# Patient Record
Sex: Female | Born: 1945 | Race: Black or African American | Hispanic: No | Smoking: Never smoker
Health system: Southern US, Community
[De-identification: ages and names within clinical notes are randomized; demographics above are authoritative.]

## PROBLEM LIST (undated history)

## (undated) DIAGNOSIS — M1990 Unspecified osteoarthritis, unspecified site: Secondary | ICD-10-CM

## (undated) DIAGNOSIS — F419 Anxiety disorder, unspecified: Secondary | ICD-10-CM

## (undated) DIAGNOSIS — E2839 Other primary ovarian failure: Secondary | ICD-10-CM

## (undated) DIAGNOSIS — M8580 Other specified disorders of bone density and structure, unspecified site: Secondary | ICD-10-CM

## (undated) DIAGNOSIS — M810 Age-related osteoporosis without current pathological fracture: Secondary | ICD-10-CM

## (undated) DIAGNOSIS — Z780 Asymptomatic menopausal state: Secondary | ICD-10-CM

## (undated) DIAGNOSIS — N952 Postmenopausal atrophic vaginitis: Secondary | ICD-10-CM

## (undated) DIAGNOSIS — E288 Other ovarian dysfunction: Secondary | ICD-10-CM

## (undated) DIAGNOSIS — M5136 Other intervertebral disc degeneration, lumbar region: Secondary | ICD-10-CM

## (undated) DIAGNOSIS — I10 Essential (primary) hypertension: Secondary | ICD-10-CM

## (undated) HISTORY — PX: EYE SURGERY: SHX253

## (undated) HISTORY — DX: Anxiety disorder, unspecified: F41.9

## (undated) HISTORY — DX: Other ovarian dysfunction: E28.8

## (undated) HISTORY — PX: APPENDECTOMY: SHX54

## (undated) HISTORY — DX: Other intervertebral disc degeneration, lumbar region: M51.36

## (undated) HISTORY — DX: Postmenopausal atrophic vaginitis: N95.2

## (undated) HISTORY — PX: TOE SURGERY: SHX1073

## (undated) HISTORY — DX: Other primary ovarian failure: E28.39

## (undated) HISTORY — DX: Age-related osteoporosis without current pathological fracture: M81.0

## (undated) HISTORY — DX: Asymptomatic menopausal state: Z78.0

## (undated) HISTORY — DX: Other specified disorders of bone density and structure, unspecified site: M85.80

## (undated) HISTORY — PX: OTHER SURGICAL HISTORY: SHX169

---

## 1999-01-02 ENCOUNTER — Encounter: Admission: RE | Admit: 1999-01-02 | Discharge: 1999-01-02 | Payer: Self-pay | Admitting: Family Medicine

## 1999-01-02 ENCOUNTER — Encounter: Payer: Self-pay | Admitting: Family Medicine

## 1999-01-11 ENCOUNTER — Encounter: Admission: RE | Admit: 1999-01-11 | Discharge: 1999-01-11 | Payer: Self-pay | Admitting: Family Medicine

## 1999-01-11 ENCOUNTER — Encounter: Payer: Self-pay | Admitting: Family Medicine

## 1999-02-06 ENCOUNTER — Other Ambulatory Visit: Admission: RE | Admit: 1999-02-06 | Discharge: 1999-02-06 | Payer: Self-pay | Admitting: Obstetrics and Gynecology

## 1999-02-20 DIAGNOSIS — Z780 Asymptomatic menopausal state: Secondary | ICD-10-CM

## 1999-02-20 HISTORY — DX: Asymptomatic menopausal state: Z78.0

## 1999-02-21 ENCOUNTER — Encounter: Payer: Self-pay | Admitting: Family Medicine

## 1999-02-21 ENCOUNTER — Encounter: Admission: RE | Admit: 1999-02-21 | Discharge: 1999-02-21 | Payer: Self-pay | Admitting: Family Medicine

## 1999-04-03 ENCOUNTER — Encounter: Admission: RE | Admit: 1999-04-03 | Discharge: 1999-04-03 | Payer: Self-pay | Admitting: Family Medicine

## 1999-04-03 ENCOUNTER — Encounter: Payer: Self-pay | Admitting: Family Medicine

## 1999-08-20 HISTORY — PX: ABDOMINAL HYSTERECTOMY: SHX81

## 1999-08-22 ENCOUNTER — Inpatient Hospital Stay (HOSPITAL_COMMUNITY): Admission: RE | Admit: 1999-08-22 | Discharge: 1999-08-24 | Payer: Self-pay | Admitting: Obstetrics and Gynecology

## 1999-08-22 ENCOUNTER — Encounter (INDEPENDENT_AMBULATORY_CARE_PROVIDER_SITE_OTHER): Payer: Self-pay | Admitting: Specialist

## 2000-01-12 ENCOUNTER — Encounter: Payer: Self-pay | Admitting: Family Medicine

## 2000-01-12 ENCOUNTER — Encounter: Admission: RE | Admit: 2000-01-12 | Discharge: 2000-01-12 | Payer: Self-pay | Admitting: Family Medicine

## 2000-11-06 ENCOUNTER — Ambulatory Visit (HOSPITAL_COMMUNITY): Admission: RE | Admit: 2000-11-06 | Discharge: 2000-11-06 | Payer: Self-pay | Admitting: *Deleted

## 2000-11-06 ENCOUNTER — Encounter (INDEPENDENT_AMBULATORY_CARE_PROVIDER_SITE_OTHER): Payer: Self-pay | Admitting: Specialist

## 2001-01-14 ENCOUNTER — Encounter: Admission: RE | Admit: 2001-01-14 | Discharge: 2001-01-14 | Payer: Self-pay | Admitting: Family Medicine

## 2001-01-14 ENCOUNTER — Encounter: Payer: Self-pay | Admitting: Family Medicine

## 2001-05-07 ENCOUNTER — Other Ambulatory Visit: Admission: RE | Admit: 2001-05-07 | Discharge: 2001-05-07 | Payer: Self-pay | Admitting: Obstetrics and Gynecology

## 2002-03-10 ENCOUNTER — Encounter: Payer: Self-pay | Admitting: Family Medicine

## 2002-03-10 ENCOUNTER — Encounter: Admission: RE | Admit: 2002-03-10 | Discharge: 2002-03-10 | Payer: Self-pay | Admitting: Family Medicine

## 2002-03-13 ENCOUNTER — Encounter: Payer: Self-pay | Admitting: Family Medicine

## 2002-03-13 ENCOUNTER — Encounter: Admission: RE | Admit: 2002-03-13 | Discharge: 2002-03-13 | Payer: Self-pay | Admitting: Family Medicine

## 2002-09-27 ENCOUNTER — Emergency Department (HOSPITAL_COMMUNITY): Admission: EM | Admit: 2002-09-27 | Discharge: 2002-09-27 | Payer: Self-pay | Admitting: Emergency Medicine

## 2002-09-28 ENCOUNTER — Inpatient Hospital Stay (HOSPITAL_COMMUNITY): Admission: AD | Admit: 2002-09-28 | Discharge: 2002-10-03 | Payer: Self-pay | Admitting: Internal Medicine

## 2002-09-29 ENCOUNTER — Encounter: Payer: Self-pay | Admitting: Internal Medicine

## 2002-09-30 ENCOUNTER — Encounter: Payer: Self-pay | Admitting: Internal Medicine

## 2002-10-01 ENCOUNTER — Encounter: Payer: Self-pay | Admitting: Internal Medicine

## 2002-11-02 ENCOUNTER — Encounter: Payer: Self-pay | Admitting: Family Medicine

## 2002-11-02 ENCOUNTER — Encounter: Admission: RE | Admit: 2002-11-02 | Discharge: 2002-11-02 | Payer: Self-pay | Admitting: Family Medicine

## 2003-04-26 ENCOUNTER — Encounter: Admission: RE | Admit: 2003-04-26 | Discharge: 2003-04-26 | Payer: Self-pay | Admitting: Family Medicine

## 2003-08-17 ENCOUNTER — Ambulatory Visit (HOSPITAL_COMMUNITY): Admission: RE | Admit: 2003-08-17 | Discharge: 2003-08-17 | Payer: Self-pay | Admitting: Family Medicine

## 2003-09-01 ENCOUNTER — Encounter: Admission: RE | Admit: 2003-09-01 | Discharge: 2003-09-01 | Payer: Self-pay | Admitting: Obstetrics and Gynecology

## 2003-09-16 ENCOUNTER — Ambulatory Visit (HOSPITAL_COMMUNITY): Admission: RE | Admit: 2003-09-16 | Discharge: 2003-09-16 | Payer: Self-pay | Admitting: Family Medicine

## 2004-04-28 ENCOUNTER — Ambulatory Visit (HOSPITAL_COMMUNITY): Admission: RE | Admit: 2004-04-28 | Discharge: 2004-04-28 | Payer: Self-pay | Admitting: Family Medicine

## 2004-05-05 ENCOUNTER — Ambulatory Visit (HOSPITAL_COMMUNITY): Admission: RE | Admit: 2004-05-05 | Discharge: 2004-05-05 | Payer: Self-pay | Admitting: *Deleted

## 2004-05-26 ENCOUNTER — Encounter: Admission: RE | Admit: 2004-05-26 | Discharge: 2004-05-26 | Payer: Self-pay | Admitting: Family Medicine

## 2004-11-15 ENCOUNTER — Encounter: Admission: RE | Admit: 2004-11-15 | Discharge: 2004-11-15 | Payer: Self-pay | Admitting: Family Medicine

## 2005-01-24 ENCOUNTER — Encounter: Admission: RE | Admit: 2005-01-24 | Discharge: 2005-01-24 | Payer: Self-pay | Admitting: Family Medicine

## 2005-07-13 ENCOUNTER — Encounter: Admission: RE | Admit: 2005-07-13 | Discharge: 2005-07-13 | Payer: Self-pay | Admitting: Family Medicine

## 2005-08-28 ENCOUNTER — Ambulatory Visit (HOSPITAL_BASED_OUTPATIENT_CLINIC_OR_DEPARTMENT_OTHER): Admission: RE | Admit: 2005-08-28 | Discharge: 2005-08-28 | Payer: Self-pay | Admitting: Radiation Oncology

## 2006-06-18 ENCOUNTER — Encounter: Admission: RE | Admit: 2006-06-18 | Discharge: 2006-06-18 | Payer: Self-pay | Admitting: Rheumatology

## 2006-07-16 ENCOUNTER — Encounter: Admission: RE | Admit: 2006-07-16 | Discharge: 2006-07-16 | Payer: Self-pay | Admitting: Family Medicine

## 2006-11-24 ENCOUNTER — Encounter: Admission: RE | Admit: 2006-11-24 | Discharge: 2006-11-24 | Payer: Self-pay | Admitting: Family Medicine

## 2006-12-31 ENCOUNTER — Encounter: Admission: RE | Admit: 2006-12-31 | Discharge: 2006-12-31 | Payer: Self-pay | Admitting: Family Medicine

## 2007-01-07 ENCOUNTER — Encounter: Admission: RE | Admit: 2007-01-07 | Discharge: 2007-01-07 | Payer: Self-pay | Admitting: Family Medicine

## 2007-07-17 ENCOUNTER — Encounter: Admission: RE | Admit: 2007-07-17 | Discharge: 2007-07-17 | Payer: Self-pay | Admitting: Family Medicine

## 2008-07-20 ENCOUNTER — Encounter: Admission: RE | Admit: 2008-07-20 | Discharge: 2008-07-20 | Payer: Self-pay | Admitting: Family Medicine

## 2008-07-20 DIAGNOSIS — M8580 Other specified disorders of bone density and structure, unspecified site: Secondary | ICD-10-CM

## 2008-07-20 HISTORY — DX: Other specified disorders of bone density and structure, unspecified site: M85.80

## 2008-07-23 ENCOUNTER — Encounter: Admission: RE | Admit: 2008-07-23 | Discharge: 2008-07-23 | Payer: Self-pay | Admitting: Obstetrics and Gynecology

## 2008-10-06 ENCOUNTER — Encounter: Admission: RE | Admit: 2008-10-06 | Discharge: 2008-10-06 | Payer: Self-pay | Admitting: Family Medicine

## 2008-12-23 ENCOUNTER — Encounter: Admission: RE | Admit: 2008-12-23 | Discharge: 2008-12-23 | Payer: Self-pay | Admitting: Obstetrics and Gynecology

## 2009-04-14 ENCOUNTER — Encounter: Admission: RE | Admit: 2009-04-14 | Discharge: 2009-04-14 | Payer: Self-pay | Admitting: Family Medicine

## 2009-09-16 ENCOUNTER — Encounter: Admission: RE | Admit: 2009-09-16 | Discharge: 2009-09-16 | Payer: Self-pay | Admitting: Family Medicine

## 2010-03-06 ENCOUNTER — Encounter
Admission: RE | Admit: 2010-03-06 | Discharge: 2010-03-06 | Payer: Self-pay | Source: Home / Self Care | Attending: Gastroenterology | Admitting: Gastroenterology

## 2010-04-07 ENCOUNTER — Ambulatory Visit (HOSPITAL_COMMUNITY): Payer: Self-pay

## 2010-04-10 ENCOUNTER — Other Ambulatory Visit (HOSPITAL_COMMUNITY): Payer: Self-pay | Admitting: Gastroenterology

## 2010-04-14 ENCOUNTER — Ambulatory Visit (HOSPITAL_COMMUNITY)
Admission: RE | Admit: 2010-04-14 | Discharge: 2010-04-14 | Disposition: A | Discharge status: Home / Self Care | Payer: BC Managed Care – PPO | Source: Ambulatory Visit | Attending: Gastroenterology | Admitting: Gastroenterology

## 2010-04-14 ENCOUNTER — Ambulatory Visit (HOSPITAL_COMMUNITY): Payer: BC Managed Care – PPO

## 2010-04-14 DIAGNOSIS — R1310 Dysphagia, unspecified: Secondary | ICD-10-CM | POA: Insufficient documentation

## 2010-05-22 ENCOUNTER — Other Ambulatory Visit: Payer: Self-pay | Admitting: Family Medicine

## 2010-05-22 ENCOUNTER — Ambulatory Visit
Admission: RE | Admit: 2010-05-22 | Discharge: 2010-05-22 | Disposition: A | Discharge status: Home / Self Care | Payer: BC Managed Care – PPO | Source: Ambulatory Visit | Attending: Family Medicine | Admitting: Family Medicine

## 2010-05-22 DIAGNOSIS — R05 Cough: Secondary | ICD-10-CM

## 2010-05-22 DIAGNOSIS — R059 Cough, unspecified: Secondary | ICD-10-CM

## 2010-07-07 NOTE — Op Note (Signed)
Rebecca Rasmussen, SPURGIN               ACCOUNT NO.:  [id number]   MEDICAL RECORD NO.:  [id number]          PATIENT TYPE:  AMB   LOCATION:  DSC                          FACILITY:  [facility]   PHYSICIAN:  Leonides Grills, M.D.     DATE OF BIRTH:  1945-07-18   DATE OF PROCEDURE:  DATE OF DISCHARGE:                                 OPERATIVE REPORT   PREOPERATIVE DIAGNOSIS:  Left great toe interphalangeal joint sesamoiditis.   POSTOPERATIVE DIAGNOSIS:  Left great toe interphalangeal joint sesamoiditis.   OPERATION:  Left great toe IP joint sesamoidectomy.   ANESTHESIA:  General.   SURGEON:  Leonides Grills, M.D.   MD ASSISTANT:  Lianne Cure, P.A.   ESTIMATED BLOOD LOSS:  Minimal.   TOURNIQUET TIME:  Approximately 20 minutes.   COMPLICATIONS:  None.   DISPOSITION:  Stable to the PAR.   INDICATIONS:  This is a 65 year old female who has had longstanding plantar  great toe pain underneath the IP joint, directly underneath the sesamoid  with callous formation.  Despite pads and anti-inflammatories, she has had  persistent pain.  She has consented for the above procedure.  All risks  which would include infection, nerve vessel injury, persistent pain, cock-up  toe deformity, 3-IP joint contractures, stiffness, arthritis and weakness  were all explained.  Questions were encouraged and answered.   OPERATION:  Patient was brought to the operation room and placed in the  supine position.  After adequate general endotracheal tube anesthesia was  administered as well as Ancef 1 gram IV piggyback.  The left lower extremity  was then prepped and draped in a sterile manner over a proximal placed thigh  tourniquet.  The wound was gradually exsanguinated and tourniquet was  elevated with 290 mmHg.  A longitudinal incision over the plantar aspect of  the great toe IP joint and just distal to this was then made.  Dissection  was carried out through skin and hemostasis was obtained.  FHL tendon was  then  identified and retracted out of harm's way.  The sesamoid was then  identified and  dissected out carefully.  Once this was removed, the joint area was  copiously irrigated with normal saline.  Skin was closed with #4-0 nylon  suture.  Sterile dressing was applied.  Hard-soled shoe was applied.  Patient was stable to the PAR.      Leonides Grills, M.D.  Electronically Signed     PB/MEDQ  D:  08/28/2005  T:  08/28/2005  Job:  [id number]

## 2010-07-07 NOTE — H&P (Signed)
[facility]  Patient:    Rebecca Rasmussen                      MRN: [id number] Adm. Date:  [id number] Attending:  Jenean Lindau                         History and Physical  IDENTIFYING DATA:  Rebecca Rasmussen is a 65 year old menopausal female with enlarging, increasing symptomatic fibroids, admitted for TAH and BSO.  HISTORY OF PRESENT ILLNESS:  Rebecca Rasmussen is a 65 year old, nulligravid, African-American female who was initially referred in 1998 by Duncan Dull, M.D., for right lower quadrant pain.  She had been menopausal and on hormonal replacement therapy since the age of 30.  She had a long history of fibroids, but had never had any problems with these.  Following that initial visit, she was noted to have an approximately 6-week size uterus that was found to have multiple small fibroids.  At that time, she was treated conservatively.  She continued to be monitored and her uterus was noted to gradually enlarge to approximately 10-12 weeks size.  Her fibroids were followed up on a recent ultrasound and she was noted to have multiple small fibroids with the largest one measuring approximately 4 cm in the right lower quadrant, which was a source of most of her pain.  She noted progressively worsening right lower quadrant pain, dyspareunia, and back pain, as well as increasing urinary frequency.  The patient was given the option of stopping her hormone replacement therapy to see if this improved her symptoms and perhaps allowed the fibroids to diminish in size.  She attempted this option briefly, but had recurrence of symptoms.  She is therefore admitted to undergo definitive surgical management.  She is to undergo a TAH and BSO.  She has been extensively counseled as to the risks, benefits, options, and complications and agrees to proceed.  PAST MEDICAL HISTORY:  Hypercholesterolemia on medication.  PAST SURGICAL HISTORY:  Tonsillectomy at the age  of 44.  PAST OBSTETRICAL HISTORY:  Nulligravida.  PAST GYNECOLOGICAL HISTORY:  Premature menopause at the age of 39 on hormone replacement therapy since.  Last Pap smear in December of 2000 within normal limits.  Mammogram in November of 2000 within normal limits.  ALLERGIES:  PREDNISOLONE causes an unknown reaction.  DARVOCET causes a rash. ALEVE and NAPROSYN cause a rash, although ibuprofen is tolerated.  AUGMENTIN causes vomiting.  TRANSFUSION HISTORY:  Negative.  CURRENT MEDICATIONS: 1. Estradiol 1 mg q.d. 2. Medroxyprogesterone 10 mg days 1-14 of each month. 3. Claritin 10 mg daily p.r.n. 4. Lipitor 10 mg daily. 5. Tums t.i.d. 6. Centrum vitamin daily. 7. Glucosamine sulfate one q.d. 8. Ferrous sulfate two q.d. 9. She recently completed a course of Zithromax.  SOCIAL HISTORY:  The patient is married.  She has no children.  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  She denies any smoking, alcohol, or illicit drug use.  FAMILY HISTORY:  Notable for hypertension in her mother, otherwise noncontributory.  REVIEW OF SYSTEMS:  Notable for the history of present illness.  She denies any bowel complaints.  She has had no abnormal bleeding.  Cardiovascular and respiratory negative.  Neurologic negative.  PHYSICAL EXAMINATION:  The physical exam performed prior to admission revealed a health African-American female in no apparent distress.  HEIGHT:  5 feet 3-1/2 inches.  WEIGHT:  141 pounds.  VITAL SIGNS:  Blood pressure 130/80, pulse 80 and regular, respirations 16, temperature 97.2 degrees.  HEENT:  Grossly negative.  Oropharynx clear.  NECK:  Supple without thyromegaly or lymphadenopathy.  BACK:  Without spine or CVA tenderness.  LUNGS:  Clear to auscultation.  HEART:  Regular rate and rhythm without murmurs, rubs, or gallops. Carotids +2 and equal without bruit.  Distal pulses full.  ABDOMEN:  Soft without hepatosplenomegaly.  Slight  tenderness in the right lower quadrant to deep palpation with no obvious mass.  PELVIC:  Examination revealed a multinodular uterus, which was mid plane and mobile, tender particularly on the right, and approximately 10-12 weeks size with multiple fibroids.  Adnexa without masses.  Rectovaginal confirmatory.  EXTREMITIES:  Without edema.  No signs of DVT.  NEUROLOGIC:  Grossly nonfocal.  LABORATORY DATA:  Laboratory studies on admission revealed a hemoglobin of 11.6, a hematocrit of 37.5, a white count of 6.2, and platelets of 356. Comprehensive metabolic profile within normal limits.  Coagulation studies normal.  Urinalysis negative.  The chest x-ray is not in the chart.  She had a follow-up chest CT which revealed a stable 9 mm, noncalcified nodule in the left lower lobe, which has not changed since August of 2000.  The EKG revealed normal sinus rhythm and left atrial enlargement with no old tracing to compare.  ASSESSMENT ON ADMISSION: 1. 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 2. Premature menopause on long-term hormonal replacement therapy. 3. Hypercholesterolemia on medication. 4. Stable left lung nodule per CT.  PLAN:  The patient is admitted for same-day surgery.  She will undergo a TAH and BSO under general endotracheal anesthesia.  Full consent has been given. She will continue on her estrogen postoperatively. DD:  08/22/99 TD:  08/22/99 Job: [id number]

## 2010-07-07 NOTE — Discharge Summary (Signed)
NAME:  Rebecca Rasmussen, Rebecca Rasmussen                         ACCOUNT NO.:  [id number]   MEDICAL RECORD NO.:  [id number]                   PATIENT TYPE:  INP   LOCATION:  0456                                 FACILITY:  [facility]   PHYSICIAN:  Rebecca Rasmussen. Polite, M.D.              DATE OF BIRTH:  February 04, 1946   DATE OF ADMISSION:  09/28/2002  DATE OF DISCHARGE:  10/03/2002                                 DISCHARGE SUMMARY   DISCHARGE DIAGNOSIS:  1. Reactive arthritis, probably secondary to diarrhea versus bowel illness,     improved at discharge.  2. Myalgia secondary to #1.  3. Fever secondary to #1.  4. Elevated liver function tests trending down, probably secondary to viral     illness.  5. Right shoulder pain, status post intra-articular injection, improved at     discharge.   DISCHARGE MEDICATIONS:  1. Prednisone in tapering doses 60 mg to zero over six days.  2. Niferex 150 mg b.i.d.  3. Estradiol 2 mg daily.  4. Protonix 40 mg daily.  5. Prednisone taper.   CONSULTANTS:  1. Dr. Ninetta Lights, infection disease.  2. Dr. Kellie Simmering, rheumatology.  3. Dr. Ranell Patrick, orthopedics.   STUDIES:  The patient had a chest x-ray negative for pneumonia, significant  for a small pleural effusion.  Aldolase 6, Lyme disease, IgG and IgM  negative.  HLA B-27 pending.  Mycoplasma and Chlamydia antibody pending.  Group A strep negative.  AST and ALT 44 and 50 respectively.  TSH 1.9,  ferritin 694.  CK within normal limits.  Blood culture negative.  Rheumatoid  factor less than 20.  Urine Chlamydia negative.  ANA negative.  Outpatient  studies - hepatitis A, B, and C negative.  ASO titer 222.  Right shoulder x-  ray with extensive soft tissue calcification in the lateral portion of the  shoulder joint area.  Abdominal ultrasound with mild splenomegaly.  Gallbladder not well distended, but appears normal.  EKG without acute  abnormalities.   HISTORY OF PRESENT ILLNESS:  A 65 year old black female with the above  medical problems who presented to the hospital for evaluation of multiple  complaints of fever, myalgias, polyarthritis, elevated LFTs, and abnormal  chest x-ray with questionable infiltrate.  The patient's symptoms seemed to  have begun approximately two weeks ago when she had a diarrheal illness that  resolved, but subsequently developed symptoms of the above diffuse myalgias,  polyarthritis, fever, and weakness.  Because of the above symptoms,  admission was deemed necessary for further evaluation and treatment.  Please  see dictated H&P for further details of history of present illness.   HOSPITAL COURSE:  1. Symptom complex consistent with inflammatory process characterized by     fever, myalgias, polyarthritis, elevated acute phase labs (i.e.     sedimentation rate of 120), elevated ASO titer of 220.  As stated, the     patient's symptoms started approximately two weeks  prior to admission     with a diarrheal illness.  Because of the progressive nature of her     symptoms and fever as mentioned, it was deemed necessary for further     evaluation and treatment.  It was initially entertained that the patient     may have had pneumonia or bronchitis; however, this was not felt to be     the case after admission to the hospital.  It was also considered to be     rheumatic fever versus a reactive polyarthritis.  Reason for     consideration of rheumatic fever is because of the constellation of     symptoms including the above-mentioned signs and symptoms, plus the     elevated ASO titer and subcu nodules, polyarthritis, and fever.  Because     of this concern, an infectious disease consult was obtained.  It was     agreed that this was a less likely cause because of the rare nature of     its occurrence.  It was felt more prudent that the patient had a reactive     __________ secondary to her diarrheal illness.  Further consultation was     obtained by rheumatology, Dr. Kellie Simmering, who  concurred with that diagnosis     and agreed with steroid taper over six days.  In the interim, the     patient's sign and symptom complex improved dramatically to the point     that she was afebrile, had dramatic improvement in her energy level, and     able to ambulate the hospital floor and tolerate full p.o.'s.  Despite     some of the testing suggesting rheumatic fever, it is still felt by all     three specialties, internal medication, infection disease, and     rheumatology, that this was most likely a reactive __________, and     conservative treatment should improve the above symptoms.  The patient     will be asked to follow up with her primary M.D. in one week, and at that     time to reevaluate if she needs to see the rheumatologist again per Dr.     Ines Bloomer recommendations.  2. Elevated liver function tests.  As stated, the patient has had elevated     LFTs, and evaluation was started on an outpatient basis.  Of most     significance, she has had negative hepatitis studies, negative ANA,     abdominal ultrasound negative for any abnormalities of the liver or     gallbladder.  Of note, the patient is recovering from a presumed viral     illness, and in the past did use a cholesterol medication which has been     stopped.  Currently, the patient is without jaundice, tolerating p.o.  It     is expected that her LFT's will improve with the resolution of problem     #1.  3. Right shoulder pain.  The patient was seen in consultation by the     orthopedist, Dr. Ranell Patrick.  The patient had an x-ray which showed extensive     soft tissue calcification in the lateral aspect of the right shoulder.     The patient underwent intra-articular injection, and had dramatic pain     relief, and dramatic improvement in range of motion.  No further follow     up from an orthopedic standpoint has been made at this time; however, if  indicated, Dr. Ranell Patrick will be glad to follow the patient on an  outpatient     basis if needed.   At this time, the patient is medically stable for discharge with outpatient  follow up.                                               Rebecca Rasmussen. Polite, M.D.    RDP/MEDQ  D:  10/03/2002  T:  10/03/2002  Job:  [id number]   cc:   Duncan Dull, M.D.  [address]  [place]  [place] [address]  Fax: [phone]

## 2010-07-07 NOTE — Consult Note (Signed)
NAME:  Rebecca Rasmussen, Rebecca Rasmussen                         ACCOUNT NO.:  [id number]   MEDICAL RECORD NO.:  [id number]                   PATIENT TYPE:  INP   LOCATION:  0456                                 FACILITY:  [facility]   PHYSICIAN:  Aundra Dubin, M.D.            DATE OF BIRTH:  1945/08/08   DATE OF CONSULTATION:  10/02/2002  DATE OF DISCHARGE:                                   CONSULTATION   CHIEF COMPLAINT:  Fever of unknown origin, polyarthralgia.   HISTORY:  Rebecca Rasmussen is a 65 year old black female who gives a good  history of starting serious diarrhea on July 30.  Two days after this she  was very sore around her shoulders and neck and just ached terribly.  She  had no rash but has noticed some nodules to appear under her skin over time.  By August 2 her whole body was aching, especially the arms, knees, legs, and  back.  When she saw Dr. Kevan Ny on September 22, 2002, she had some mild fever and  was treated with Vioxx.  She did not improve over the week and had to return  on September 25, 2002.  She was having some low-grade fever of about 99 at that  time.  She had some vomiting during this period, and the diarrhea had  stopped.  Her main problem was that she was still aching.  By September 28, 2002, she was quite short of breath.  She was still having fever and a great  deal of pain.  She was felt to have a left lower lobe pneumonia and was  admitted.  She has had no swollen joints.  She does not feel that she has  lost any appreciable weight.  Prior to the achiness beginning in early  August, she generally did not have a pain problem.  At this point she is  feeling about 75% better.  In addition to the leg pain, she was having  abdominal pain.  As she was admitted, laboratories showed low-grade anemia  of about 11.3.  She had an AST of 44, ALT 77, albumin 3.4.  Platelets 394,  WBC 6.8 and 6.6.  TSH 1.98.  She has had a negative ANA, rheumatoid factor,  and parvovirus B19.  Her chest  x-ray on September 30, 2002, showed some minimal  blunting of the costophrenic angles and mild generalized peribronchial  thickening.  There was no definite infiltrate or consolidation.  She has  been treated with antibiotics during this hospitalization.   PAST MEDICAL/SURGICAL HISTORY:  1. Hyperlipidemia.  2. Hysterectomy.  3. Tonsillectomy.   CURRENT MEDICINES:  1. Niferex 150 mg b.i.d.  2. Estradiol 2 mg daily.  3. Protonix 40 mg daily.  4. Claritin p.r.n.  5. Hydrocodone p.r.n.  6. Ambien 10 mg h.s.  7. Avelox.   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  She does not smoke or drink alcohol.  She is married.   FAMILY HISTORY:  Her mother has a history of lung cancer and hypertension.  Her father has died.   PHYSICAL EXAMINATION:  VITAL SIGNS:  Temperature afebrile, blood pressure  157/87, respirations 16, pulse 80.  GENERAL:  She is in no distress.  SKIN:  I do palpate a few slight nodules under the skin of the forearms.  These were not remarkable.  There is no malar rash or nail-fold dilatation.  HEENT:  Bilateral sclerae are injected to the lateral side of the pupil.  The eyes were slightly tender. PERL/EOMI.  Mouth clear.  NECK:  No adenopathy.  Normal thyroid.  LUNGS:  Clear.  HEART:  Regular.  No murmur.  ABDOMEN:  Negative HSM.  Nontender.  MUSCULOSKELETAL:  The hands have a mild diffuse swelling to the PIPs and  MCPs.  These areas were tender.  Right wrist was mildly swollen and  moderately tender.  The left wrist had mild tenderness.  Elbows were tender.  The shoulders move stiffly, especially the right, which has been injected.  Trigger points around the shoulder, neck, occiput, anterior chest, and upper  paraspinous muscles were nontender.  The knees were cool but stiff and had  some mild tenderness.  The ankles and feet were cool and nontender.  NEUROLOGIC:  Nonfocal.   ASSESSMENT AND PLAN:  Fever of unknown  origin with polyarthritis.  There is  some mild swelling to the joints, and I think this is possibly a reactive  arthritis.  I would suspect a viral entity with the mild increased liver  enzymes, fever, and an albumin slightly low at 3.4.  She did not have  leukocytosis.  She has also had a mildly elevated ASO, which I believe is  nonspecific.  Usually with rheumatic fever, fevers remain over 102 for days.  Hers has been low-grade fever of 100-100.8.  I believe she will benefit from  a short course of prednisone.  The chart indicates an allergy to  PREDNISOLONE, but I believe it was some intolerance and not a true allergy  to steroids.  At the present time I do not believe that she has  fibromyalgia.  This pain came on with the bouts of diarrhea and shortly  thereafter.   I will see her at this time.  She is well enough to go on home.  She should  follow up with Dr. Kevan Ny in about one week, and if I am needed after this I  should be contacted.                                               Aundra Dubin, M.D.    WWT/MEDQ  D:  10/02/2002  T:  10/02/2002  Job:  [id number]   cc:   Duncan Dull, M.D.  [address]  [place]  [place] [address]  Fax: [phone]

## 2010-07-07 NOTE — H&P (Signed)
NAME:  Rebecca Rasmussen, Rebecca Rasmussen                         ACCOUNT NO.:  [id number]   MEDICAL RECORD NO.:  [id number]                   PATIENT TYPE:  INP   LOCATION:  0456                                 FACILITY[facility]   PHYSICIAN:  Sherin Quarry, MD                   DATE OF BIRTH:  10-Apr-1945   DATE OF ADMISSION:  09/28/2002  DATE OF DISCHARGE:                                HISTORY & PHYSICAL   PROBLEM LIST:  1. Probable left lower lobe pneumonia.  2. Chronic pain syndrome, possible fibromyalgia.  3. Hyperlipidemia.  4. Status post hysterectomy and bilateral salpingo-oophorectomy.  5. Allergic rhinitis.  6. History of anemia.   HISTORY OF PRESENT ILLNESS:  This 65 year old lady reports onset  approximately 1 week ago of an illness characterized by vomiting  subsequently followed by low grade fever, nonproductive cough, diffuse chest  ache, frontal headache and malaise.  Vomiting and diarrhea have resolved.  The patient has been experiencing diffuse arthralgias for several weeks.  She thought these might perhaps be due to side effects of the Crestor and  for this reason she stopped taking this medication about 5 weeks ago.  This  does not seem to have helped very much.  In Dr. Kevan Ny' office and in the  emergency room on Sunday evening workup included a CMET which showed normal  liver functions, a CBC which revealed a white count of 6900, a hemoglobin of  10.9, a urinalysis which revealed few white cells and a chest x-ray which  showed possible left lower lobe infiltrate.  The patient had previously had  an ANA, CK and RA Latex which were all normal or negative.  She is admitted  for evaluation of these complaints.   PAST MEDICAL HISTORY:   ALLERGIES:  She is allergic to PREDNISOLONE, DARVOCET, ALEVE, NAPROSYN, and  AUGMENTIN.   CURRENT MEDICATIONS:  Estradiol 2 mg daily, Crestor which she stopped about  5 weeks ago, Vicodin p.r.n., Ferro-Sequels p.r.n., Allegra-D p.r.n.,  Nasacort  p.r.n. and glucosamine 500 mg three times daily.   ILLNESSES:  The patient has a past history of hyperlipidemia which  apparently is well regulated.   OPERATIONS:  She is status post hysterectomy and bilateral salpingo-  oophorectomy and tonsillectomy.   FAMILY HISTORY:  The patient's mother had a history of lung cancer and  hypertension.  Her father died of unknown cause.  Her siblings are said to  be in good health.   SOCIAL HISTORY:  The patient works as a Runner, broadcasting/film/video for [employer].  She denies any history of smoking or alcohol abuse.  She denies  history of drug abuse.   REVIEW OF SYSTEMS:  HEAD: See above.  EYES: She denies visual blurring,  diplopia.  EARS/NOSE AND THROAT: She denies earache, sinus pain, or sore  throat.  CHEST: She denies chronic symptoms of respiratory difficulty  however, the last  several days she feels that she has been slightly more  short of breath than usual.  There has been no productive cough.  CARDIOVASCULAR: She denies orthopnea, PND, ankle edema or exertional chest  pain.  GI: See above.  GU: She denies dysuria, urinary frequency, hesitancy  or nocturia.  RHEUMATOLOGIC: The patient has had chronic complaints of  arthralgias without objective signs of arthritis.  HEMATOLOGIC: She denies  easy bleeding or bruising.  NEUROLOGIC: There is no history of seizure or  stroke.   PHYSICAL EXAMINATION:  HEENT:  Within normal limits.  CHEST:  Decreased breath sounds at the left base with mild wheezing and  scattered rhonchi.  CARDIOVASCULAR:  Normal S1 and S2 without rubs, murmurs, or gallops.  ABDOMEN:  Within normal limits.  There are no masses, tenderness, or  organomegaly.  NEUROLOGIC TESTING:  Within normal limits.  EXTREMITIES:  No swelling or synovial thickening of the joints.  There is no  rashes or edema.   ASSESSMENT AND PLAN:  I will assume that the patient's diagnosis of  pneumonia is accurate.  I reviewed her chest x-rays.   She does seem to have  a left lower lobe infiltrate.  I will start her on oxygen, nebulizers and  treat her with Avelox 400 mg daily.  I doubt we are going to be able to get  a sputum specimen.  We will continue pain medicines and fluid medicines.  We  will follow her course.  Many of her pain complaints may be chronic.  We  will evaluate the anemia by checking stools for Hemoccult and folate and  B12.                                               Sherin Quarry, MD    SY/MEDQ  D:  09/28/2002  T:  09/28/2002  Job:  [id number]   cc:   Duncan Dull, M.D.  [address]  [place]  [place] [address]  Fax: [phone]

## 2010-07-07 NOTE — Op Note (Signed)
[facility]  Patient:    Rebecca Rasmussen, Rebecca Rasmussen                      MRN: [id number] Proc. Date: 08/22/99 Adm. Date:  [id number] Attending:  Jenean Lindau CC:         Laqueta Linden, M.D.             Duncan Dull, M.D.                           Operative Report  PREOPERATIVE DIAGNOSIS:  Leiomyomata uteri.  POSTOPERATIVE DIAGNOSIS:  Leiomyomata uteri.  PROCEDURE:  Total abdominal hysterectomy with bilateral salpingo-oophorectomy.  SURGEON:  Laqueta Linden, M.D.  ASSISTANT:  Andres Ege, M.D.  ANESTHESIA:  General endotracheal.  ESTIMATED BLOOD LOSS:  Less than 100 cc.  URINE OUTPUT:  500 cc.  FLUIDS:  2200 cc of crystalloid.  COUNTS:  Correct x 2.  COMPLICATIONS:  None.  INDICATION FOR PROCEDURE:  Rebecca Rasmussen is a 65 year old menopausal female on hormone replacement therapy who has had progressively debilitating pelvic pain related to known multiple fibroids. Clinically she had an enlarged fibroid uterus. She had predominantly right-sided fibroids. She was noticing right lower quadrant and right back pain with increasing numbers of fibroids on serial ultrasound examinations. She was given the option of temporarily discontinuing her hormone replacement therapy to see if this helped the situation but this caused an increase in menopausal symptoms. She is to therefore undergo TAH, BSO as definitive surgical management. She has had no problems with abnormal bleeding. She has been extensively counseled as to the risks, benefits, options, and complications of the procedure and agrees to proceed.  DESCRIPTION OF PROCEDURE:  The patient was taken to the operating room and after proper identification and consents were ascertained, she was placed on the operating table in the supine position. She had received 1 gm of Ancef as preoperative antibiotic prophylaxis. After the induction of general endotracheal anesthesia, she was placed in the frog  leg position and the abdomen, perineum and vagina were prepped and draped in a routine sterile fashion. A transurethral Foley was placed. A Pfannenstiel incision was then made and carried down to the level of the anterior rectus fascia. The fascia was opened laterally, superiorly and inferiorly and the rectus muscles were separated. The parietoperitoneum was elevated and incised and the incision extended superiorly and inferiorly to the level of the bladder. Palpation of the upper abdomen revealed smooth renal contours bilaterally, smooth spleen tip and liver edge with no palpable gallstones. The appendix was noted to be quite long but had no obvious stone, adhesions or other lesions noted. A self retaining retractor was placed and the bile packed into the upper abdomen using moistened lap packs. Inspection of the pelvis revealed the uterus to be distorted by multiple serosal fibroids. The fibroids extended onto the anterior lower uterine segment on the right also out onto the round ligament and fallopian tubes on the left. Both ovaries were tiny and menopausal in appearance. There were no other pelvic lesions identified other than multiple benign appearing fibroids. The uterus was elevated in the operative field. The found ligaments were clamped, cut and suture ligated. Dissection was carried forward in the anterior leaf of the broad ligament with advancement of the bladder off of the anterior lower uterine segment and cervix. A window was made in the posterior round ligament and after identification of  the ureter well out of the operative field. Curved Heaney clamps were placed across the infundibulopelvic ligaments bilaterally. These pedicles were cut and doubly ligated with a free tie a stitch of #0 Vicryl. The uterine vessels were then skeletonized bilaterally. Curved Heaney clamps were placed perpendicularly across the vessels at the level of the internal os. Pedicles were cut  and suture ligated. Successive straight Heaney clamps were placed across the cardinal ligaments bilaterally after further advancement of the bladder. Pedicles were cut and suture ligated in a routine fashion.  This was carried down to the level of the upper vaginal angles. Curved Heaney clamps were placed across the upper vaginal angle with pedicles cut and the Satinsky scissors were then used to circumscribe the cervix with removal of the entire specimen which was sent to pathology. The vaginal angles were plicated in a routine Richardson angled stitch. The remainder of the vaginal cuff was closed interrupted figure-of-eight sutures of #0 Vicryl. Copious lavage was accomplished. Hemostasis was noted to be excellent.  Uterosacral ligaments were plicated posteriorly. Lavage was again accomplished. All pedicles were hemostatic. There was no active bleeding noted. All lap packs were removed. Needle, sponge and instrument counts were correct prior to closure of the abdomen. The parietoperitoneum was closed in a running fashion using 2-0 Vicryl suture. The rectus muscles were loosely reapproximated in the midline. After subfascial hemostasis was ascertained, the fascia was closed from both lateral aspects to the midline using a running stitch of #0 Maxon. Subcutaneous hemostasis was ascertained. The skin was closed with staples and Steri-Strips and pressure dressings were then applied. The patient was stable and extubated on transfer to the recovery room. Estimated blood loss was less than 100 cc. Urine output 500 cc. Fluids 2200 cc of crystalloid. Counts correct x 2. Complications none. DD:  08/22/99 TD:  08/22/99 Job: [id number]

## 2010-09-19 ENCOUNTER — Other Ambulatory Visit: Payer: Self-pay | Admitting: Family Medicine

## 2010-09-19 DIAGNOSIS — Z1231 Encounter for screening mammogram for malignant neoplasm of breast: Secondary | ICD-10-CM

## 2010-10-10 ENCOUNTER — Ambulatory Visit
Admission: RE | Admit: 2010-10-10 | Discharge: 2010-10-10 | Disposition: A | Discharge status: Home / Self Care | Payer: BC Managed Care – PPO | Source: Ambulatory Visit | Attending: Family Medicine | Admitting: Family Medicine

## 2010-10-10 DIAGNOSIS — Z1231 Encounter for screening mammogram for malignant neoplasm of breast: Secondary | ICD-10-CM

## 2011-05-09 ENCOUNTER — Ambulatory Visit
Admission: RE | Admit: 2011-05-09 | Discharge: 2011-05-09 | Disposition: A | Discharge status: Home / Self Care | Payer: Medicare Other | Source: Ambulatory Visit | Attending: Family Medicine | Admitting: Family Medicine

## 2011-05-09 ENCOUNTER — Other Ambulatory Visit: Payer: Self-pay | Admitting: Family Medicine

## 2011-05-09 DIAGNOSIS — R52 Pain, unspecified: Secondary | ICD-10-CM

## 2011-05-09 DIAGNOSIS — R609 Edema, unspecified: Secondary | ICD-10-CM

## 2011-05-20 ENCOUNTER — Ambulatory Visit (INDEPENDENT_AMBULATORY_CARE_PROVIDER_SITE_OTHER): Payer: Medicare Other | Admitting: Family Medicine

## 2011-05-20 VITALS — BP 148/76 | HR 91 | Temp 98.8°F | Resp 16

## 2011-05-20 DIAGNOSIS — R05 Cough: Secondary | ICD-10-CM

## 2011-05-20 DIAGNOSIS — R059 Cough, unspecified: Secondary | ICD-10-CM

## 2011-05-20 DIAGNOSIS — J40 Bronchitis, not specified as acute or chronic: Secondary | ICD-10-CM

## 2011-05-20 DIAGNOSIS — J209 Acute bronchitis, unspecified: Secondary | ICD-10-CM

## 2011-05-20 MED ORDER — BENZONATATE 200 MG PO CAPS: 200.0000 mg | ORAL_CAPSULE | Freq: Two times a day (BID) | ORAL | Status: AC | PRN

## 2011-05-20 MED ORDER — AZITHROMYCIN 250 MG PO TABS: ORAL_TABLET | ORAL | Status: AC

## 2011-05-20 NOTE — Patient Instructions (Signed)

## 2011-05-20 NOTE — Progress Notes (Signed)
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 The cough is nonproductive with yellow to green mucus. He can take Mucinex which has not helped much. Is no sinus congestion, sore throat, earache, fever, or chest pain. She's not short of breath and has no slow swelling on her legs.  Objective: No acute distress, alert and cooperative very friendly.  HEENT: Unremarkable  Chest: Bilateral rhonchi  Heart: Regular no murmur  Extremities no edema  Assessment acute bronchitis, worsening  Plan: Z-Pak and Tessalon

## 2011-09-17 ENCOUNTER — Other Ambulatory Visit: Payer: Self-pay | Admitting: Family Medicine

## 2011-09-17 DIAGNOSIS — Z1231 Encounter for screening mammogram for malignant neoplasm of breast: Secondary | ICD-10-CM

## 2011-10-15 ENCOUNTER — Ambulatory Visit
Admission: RE | Admit: 2011-10-15 | Discharge: 2011-10-15 | Disposition: A | Discharge status: Home / Self Care | Payer: Medicare Other | Source: Ambulatory Visit | Attending: Family Medicine | Admitting: Family Medicine

## 2011-10-15 DIAGNOSIS — Z1231 Encounter for screening mammogram for malignant neoplasm of breast: Secondary | ICD-10-CM

## 2012-05-20 DIAGNOSIS — M5136 Other intervertebral disc degeneration, lumbar region: Secondary | ICD-10-CM

## 2012-05-20 DIAGNOSIS — M51369 Other intervertebral disc degeneration, lumbar region without mention of lumbar back pain or lower extremity pain: Secondary | ICD-10-CM

## 2012-05-20 HISTORY — DX: Other intervertebral disc degeneration, lumbar region: M51.36

## 2012-05-20 HISTORY — DX: Other intervertebral disc degeneration, lumbar region without mention of lumbar back pain or lower extremity pain: M51.369

## 2012-06-23 ENCOUNTER — Encounter: Payer: Self-pay | Admitting: Nurse Practitioner

## 2012-06-23 ENCOUNTER — Ambulatory Visit (INDEPENDENT_AMBULATORY_CARE_PROVIDER_SITE_OTHER): Payer: Medicare Other | Admitting: Nurse Practitioner

## 2012-06-23 VITALS — BP 130/68 | HR 66 | Ht 62.0 in | Wt 135.8 lb

## 2012-06-23 DIAGNOSIS — M8580 Other specified disorders of bone density and structure, unspecified site: Secondary | ICD-10-CM

## 2012-06-23 DIAGNOSIS — M899 Disorder of bone, unspecified: Secondary | ICD-10-CM

## 2012-06-23 DIAGNOSIS — E559 Vitamin D deficiency, unspecified: Secondary | ICD-10-CM

## 2012-06-23 DIAGNOSIS — Z01419 Encounter for gynecological examination (general) (routine) without abnormal findings: Secondary | ICD-10-CM

## 2012-06-23 MED ORDER — VITAMIN D (ERGOCALCIFEROL) 1.25 MG (50000 UNIT) PO CAPS: 50000.0000 [IU] | ORAL_CAPSULE | ORAL | Status: DC

## 2012-06-23 NOTE — Progress Notes (Signed)
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 Some vaginal dryness, currently not sexually active. Denies vaso symptoms.   Recent fall 06/14/12 and during evaluation found that she had DDD of lumbar spine and bone spur of the cervical neck. She is currently In physical therapy .   No LMP recorded. Patient has had a hysterectomy.          Sexually active: no  The current method of family planning is status post hysterectomy.    Exercising: no   Smoker:  no  Health Maintenance: Pap:  05/07/2001 normal  MMG:  09/17/11 normal Colonoscopy:  04/2008 some diverticula and repeat in 5 years BMD:   07/2008 T Score: spine -1.5; Left hip neck -1.4 TDaP:  06/07/2011 Labs: PCP does UA and maintains blood work.    reports that she has never smoked. She does not have any smokeless tobacco history on file. She reports that she does not drink alcohol or use illicit drugs.  Past Medical History  Diagnosis Date  . Osteoporosis     osteopenia  . Menopause   . Anxiety   . Atrophic vaginitis     Past Surgical History  Procedure Laterality Date  . Abdominal hysterectomy  08/1999    Current Outpatient Prescriptions  Medication Sig Dispense Refill  . aspirin 81 MG tablet Take 81 mg by mouth 2 (two) times daily.      . calcium-vitamin D (CALCIUM + D) 250-125 MG-UNIT per tablet Take 1 tablet by mouth daily.      . celecoxib (CELEBREX) 200 MG capsule Take 200 mg by mouth daily. PRN      . Coenzyme Q10 (COQ10) 100 MG CAPS Take by mouth daily.      . colesevelam (WELCHOL) 625 MG tablet Take 1,875 mg by mouth 2 (two) times daily with a meal.      . esomeprazole (NEXIUM) 40 MG capsule Take 40 mg by mouth daily before breakfast.      . Glucosamine-Chondroit-Vit C-Mn (GLUCOSAMINE CHONDR 1500 COMPLX) CAPS Take by mouth daily.      . Loratadine (CLARITIN) 10 MG CAPS Take by mouth as needed.      . Magnesium 400 MG CAPS Take by mouth daily.      . mometasone (NASONEX) 50 MCG/ACT nasal spray Place 2  sprays into the nose daily.      . Multiple Vitamins-Minerals (CENTRUM SPECIALIST HEART PO) Take by mouth 2 (two) times daily.      . rosuvastatin (CRESTOR) 10 MG tablet Take 10 mg by mouth 3 (three) times a week.      . vitamin B-12 (CYANOCOBALAMIN) 1000 MCG tablet Take 1,000 mcg by mouth daily.      . vitamin C (ASCORBIC ACID) 500 MG tablet Take 500 mg by mouth daily.      . Vitamin D, Ergocalciferol, (DRISDOL) 50000 UNITS CAPS Take 50,000 Units by mouth every 7 (seven) days.      . vitamin E 100 UNIT capsule Take 400 Units by mouth daily.      . polyvinyl alcohol-povidone (REFRESH) 1.4-0.6 % ophthalmic solution Place 1-2 drops into both eyes 2 (two) times daily. 2 drops in each eye twice daily       No current facility-administered medications for this visit.    History reviewed. No pertinent family history.  ROS:  Pertinent items are noted in HPI.  Otherwise, a comprehensive ROS was negative.  Exam:   BP 130/68  Pulse 66  Ht 5\' 2"  (  1.575 m)  Wt 135 lb 12.8 oz (61.598 kg)  BMI 24.83 kg/m2 Height: 5\' 2"  (157.5 cm)  Ht Readings from Last 3 Encounters:  06/23/12 5\' 2"  (1.575 m)    General appearance: alert, cooperative and appears stated age Head: Normocephalic, without obvious abnormality, atraumatic Neck: no adenopathy, supple, symmetrical, trachea midline and thyroid normal to inspection and palpation Lungs: clear to auscultation bilaterally Breasts: normal appearance, no masses or tenderness Heart: regular rate and rhythm Abdomen: soft, non-tender; no masses,  no organomegaly Extremities: extremities normal, atraumatic, no cyanosis or edema Skin: Skin color, texture, turgor normal. No rashes or lesions Lymph nodes: Cervical, supraclavicular, and axillary nodes normal. No abnormal inguinal nodes palpated Neurologic: Grossly normal   Pelvic: External genitalia:  no lesions              Urethra:  normal appearing urethra with no masses, tenderness or lesions               Bartholin's and Skene's: normal                 Vagina: normal appearing vagina with normal color and discharge, no lesions              Cervix: absent              Pap taken: no Bimanual Exam:  Uterus:  uterus absent              Adnexa: no mass, fullness, tenderness               Rectovaginal: Confirms               Anus:  normal sphincter tone, no lesions  A:  Well Woman with normal exam  S/P TAH secondary to fibroids ERT 7/01 - 2/07  Osteopenia  Vit D def.  P:   Pap smear as per guidelines   Mammogram due 09/2012  Order placed for BMD  Vit D 50,000 IU refilled - directions pending labs  counseled on osteoporosis, adequate intake of calcium and vitamin D,   diet and exercise  return annually or prn  An After Visit Summary was printed and given to the patient.

## 2012-06-23 NOTE — Patient Instructions (Addendum)

## 2012-06-24 LAB — VITAMIN D 25 HYDROXY (VIT D DEFICIENCY, FRACTURES): Vit D, 25-Hydroxy: 62 ng/mL (ref 30–89)

## 2012-06-25 ENCOUNTER — Telehealth: Payer: Self-pay | Admitting: *Deleted

## 2012-06-25 NOTE — Telephone Encounter (Signed)
Pt is aware of vitamin D lab results and is agreeable to vitamin D OTC (400IU qd).

## 2012-06-26 NOTE — Progress Notes (Signed)
Encounter reviewed by Dr. Rifka Ramey Silva.  

## 2012-09-26 ENCOUNTER — Other Ambulatory Visit: Payer: Self-pay

## 2012-09-26 DIAGNOSIS — Z1231 Encounter for screening mammogram for malignant neoplasm of breast: Secondary | ICD-10-CM

## 2012-11-04 IMAGING — RF DG ESOPHAGUS
16 of 20 series · 19 of 24 positions shown · non-contrast
Comparison: None.

CLINICAL DATA: Dysphagia

ESOPHOGRAM/BARIUM SWALLOW
TECHNIQUE: Combined double contrast and single contrast
examination performed using effervescent crystals, thick barium
liquid, and thin barium liquid.
Fluoroscopy time:  2.30 minutes.

[Series 1: run · 2 of 8 slices shown (1 of 16)]
[im 1/8]
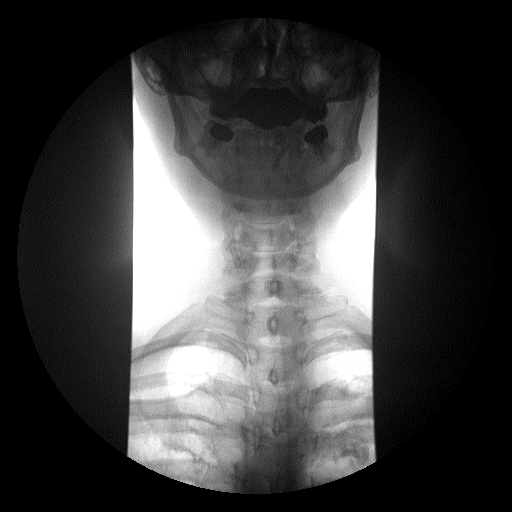
[im 4/8]
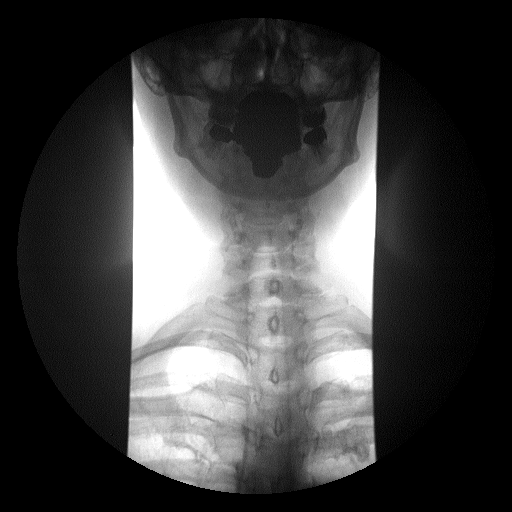

[Series 2: run · 3 of 8 slices shown (2 of 16)]
[im 1/8]
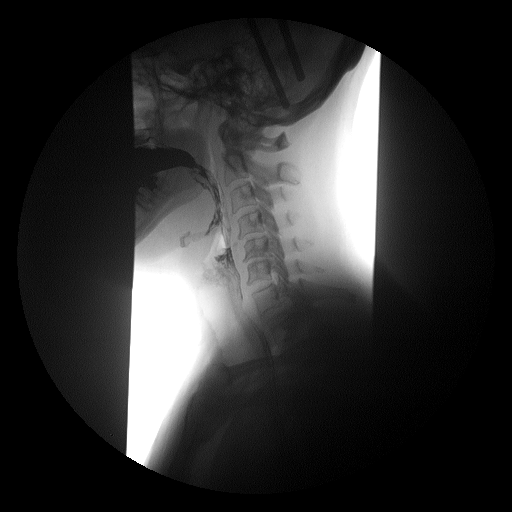
[im 4/8]
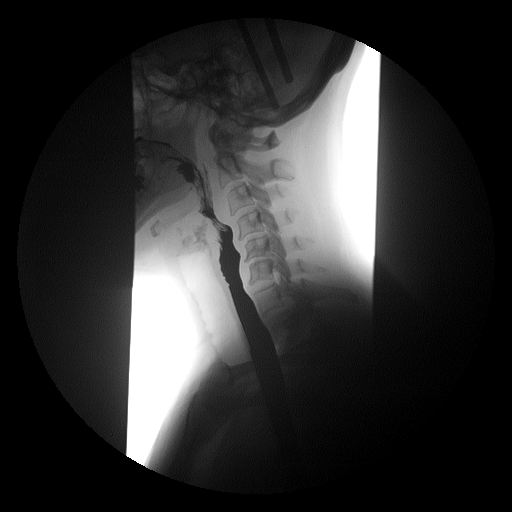
[im 8/8]
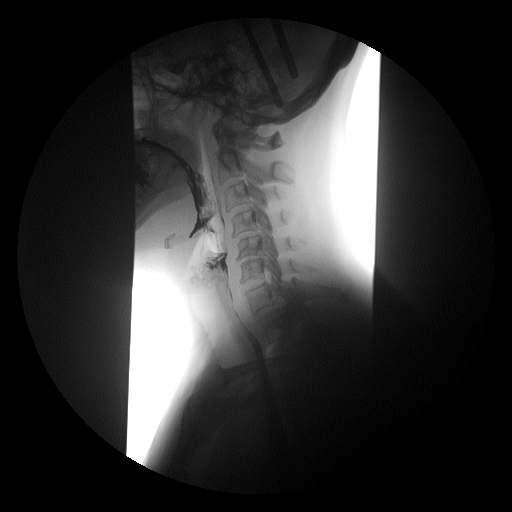

[Series 3: run · 1 of 1 slices shown (3 of 16)]
[im 1/1]
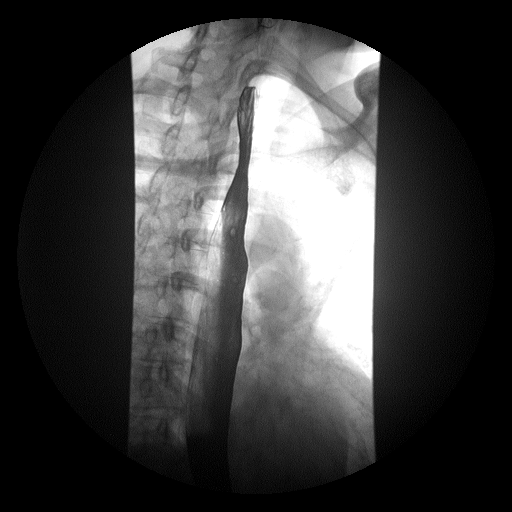

[Series 5: run · 1 of 1 slices shown (4 of 16)]
[im 1/1]
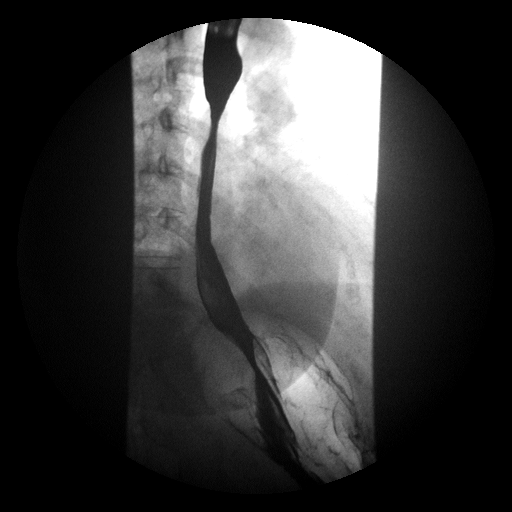

[Series 6: run · 1 of 1 slices shown (5 of 16)]
[im 1/1]
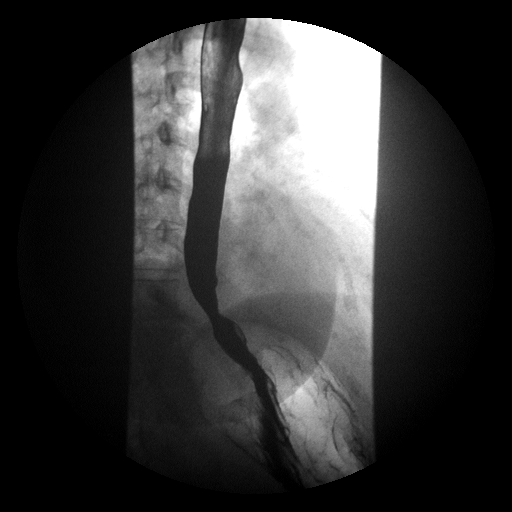

[Series 7: run · 1 of 1 slices shown (6 of 16)]
[im 1/1]
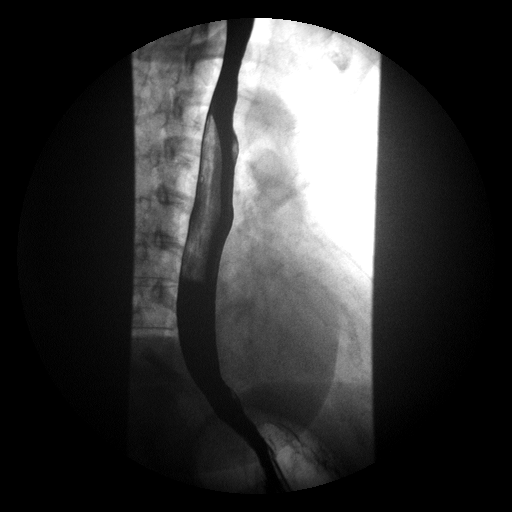

[Series 9: run · 1 of 1 slices shown (7 of 16)]
[im 1/1]
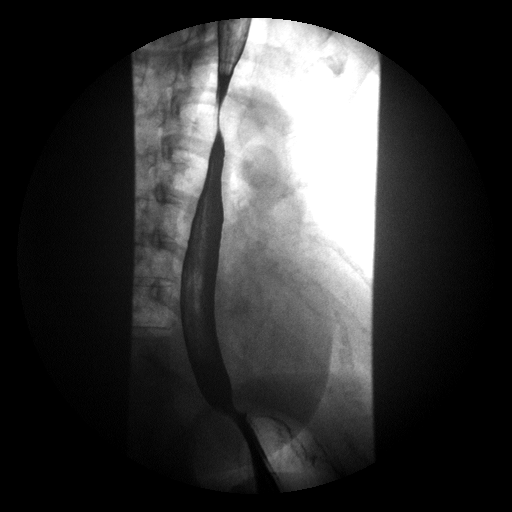

[Series 10: run · 1 of 1 slices shown (8 of 16)]
[im 1/1]
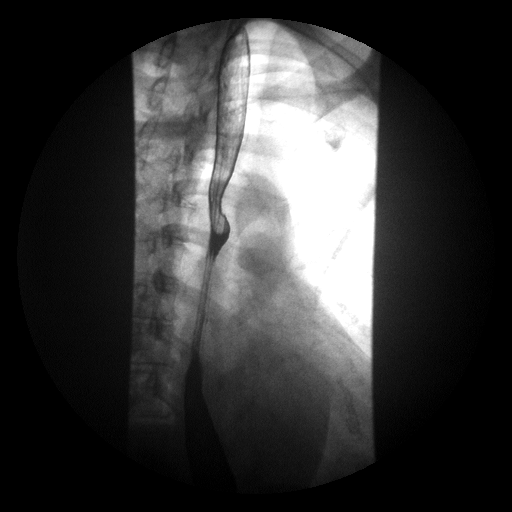

[Series 11: run · 1 of 1 slices shown (9 of 16)]
[im 1/1]
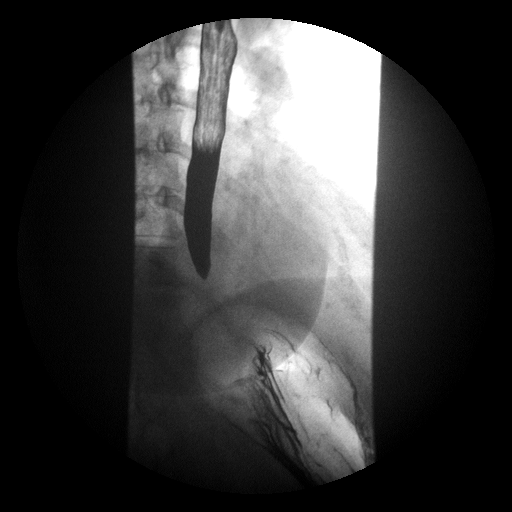

[Series 12: run · 1 of 1 slices shown (10 of 16)]
[im 1/1]
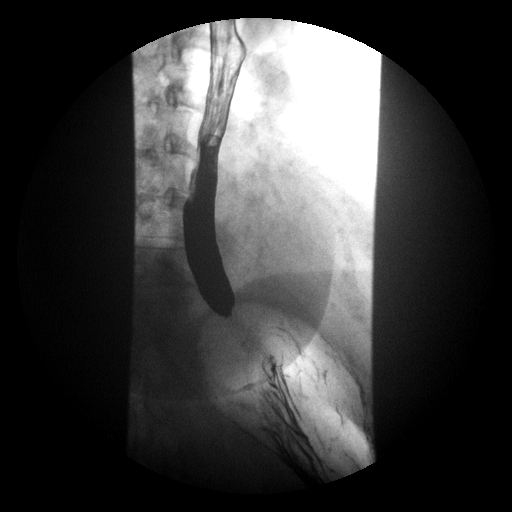

[Series 14: run · 1 of 1 slices shown (11 of 16)]
[im 1/1]
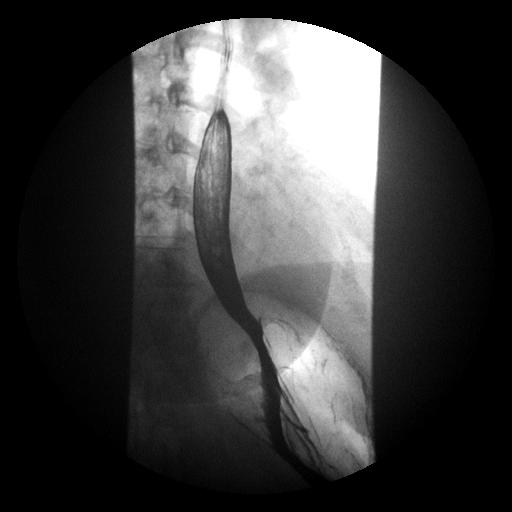

[Series 15: run · 1 of 1 slices shown (12 of 16)]
[im 1/1]
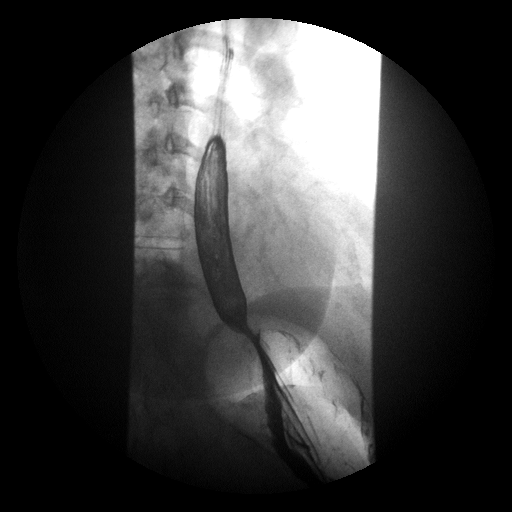

[Series 16: run · 1 of 1 slices shown (13 of 16)]
[im 1/1]
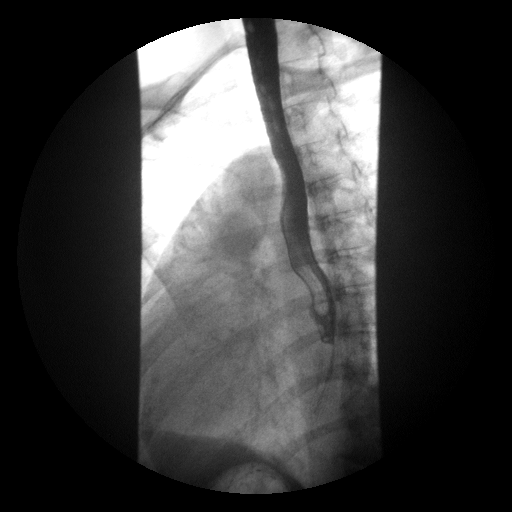

[Series 17: run · 1 of 1 slices shown (14 of 16)]
[im 1/1]
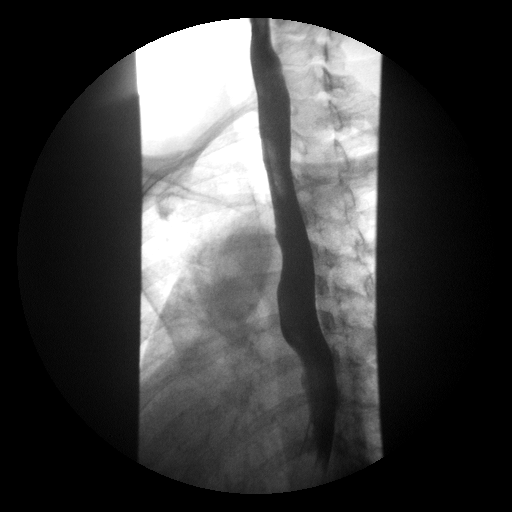

[Series 19: run · 1 of 1 slices shown (15 of 16)]
[im 1/1]
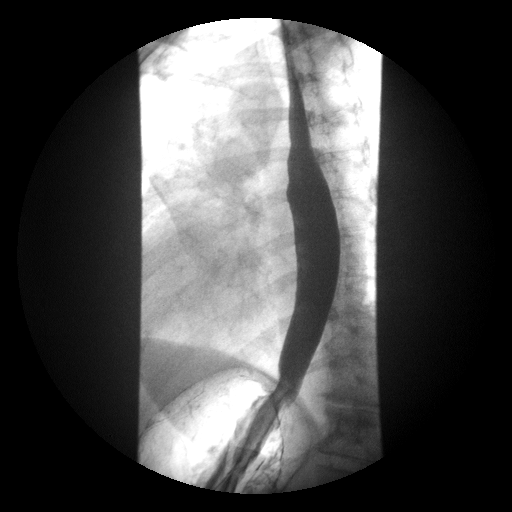

[Series 20: run · 1 of 1 slices shown (16 of 16)]
[im 1/1]
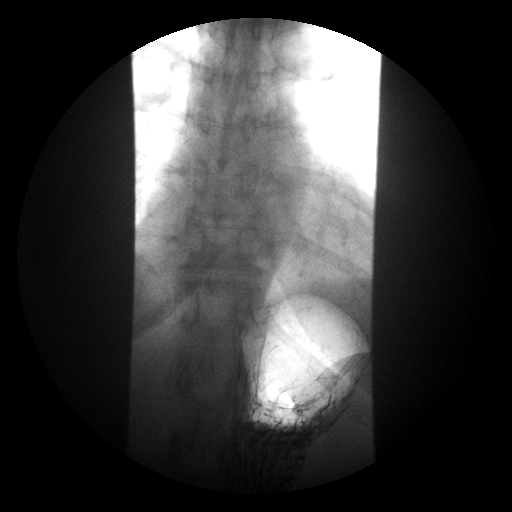

[19 of 24 positions shown; findings below may reference images not displayed]

FINDINGS: Frontal and lateral views of the hypopharynx obtained
while swallowing are normal.  Double contrast imaging of the
esophagus is normal, without evidence for esophageal stricture,
diverticulum, mass lesion, or mucosal ulcer.  There is no evidence
for hiatal hernia.  Esophageal motility is normal.  No evidence for
hiatal hernia.

13 mm barium tablet lodges temporarily in the proximal one third of
the esophagus, but then passes readily into the stomach with a
second sip of water.
IMPRESSION: No evidence for esophageal mass lesion, ulcer, or stricture.

## 2012-11-06 ENCOUNTER — Ambulatory Visit
Admission: RE | Admit: 2012-11-06 | Discharge: 2012-11-06 | Disposition: A | Discharge status: Home / Self Care | Payer: Medicare Other | Source: Ambulatory Visit

## 2012-11-06 DIAGNOSIS — Z1231 Encounter for screening mammogram for malignant neoplasm of breast: Secondary | ICD-10-CM

## 2012-11-13 ENCOUNTER — Encounter (INDEPENDENT_AMBULATORY_CARE_PROVIDER_SITE_OTHER): Payer: Self-pay | Admitting: Surgery

## 2012-11-19 ENCOUNTER — Other Ambulatory Visit (INDEPENDENT_AMBULATORY_CARE_PROVIDER_SITE_OTHER): Payer: Self-pay | Admitting: Surgery

## 2012-11-19 ENCOUNTER — Encounter (INDEPENDENT_AMBULATORY_CARE_PROVIDER_SITE_OTHER): Payer: Self-pay | Admitting: Surgery

## 2012-11-19 ENCOUNTER — Ambulatory Visit (INDEPENDENT_AMBULATORY_CARE_PROVIDER_SITE_OTHER): Payer: Medicare Other | Admitting: Surgery

## 2012-11-19 VITALS — BP 130/76 | HR 68 | Temp 98.0°F | Resp 16 | Ht 62.0 in | Wt 138.2 lb

## 2012-11-19 DIAGNOSIS — R10812 Left upper quadrant abdominal tenderness: Secondary | ICD-10-CM

## 2012-11-19 DIAGNOSIS — R1012 Left upper quadrant pain: Secondary | ICD-10-CM

## 2012-11-19 DIAGNOSIS — R10819 Abdominal tenderness, unspecified site: Secondary | ICD-10-CM

## 2012-11-19 NOTE — Progress Notes (Signed)
Patient ID: Rebecca Rasmussen, female   DOB: 1945-04-11, 67 y.o.   MRN: [id number]  Chief Complaint  Patient presents with  . New Evaluation    eval mass LUQ    HPI Rebecca Rasmussen is a 67 y.o. female.   HPI She is referred by Dr. Shaune Pollack for evaluation of a possible nodule in the abdominal wall of the left upper quadrant.  She saw Dr. Kevan Ny approximately one month ago. At that time she has noticed a mass approximately several weeks prior to that. She described in this tiny nodules. She now has left upper quadrant pain. She has no previous history of trauma to this area and has no previous history of abdominal or subcutaneous changes nodules. Past Medical History  Diagnosis Date  . Osteoporosis     osteopenia  . Menopause 2001    Took ERT 08/1999 - 2/ 2007  . Atrophic vaginitis   . Anxiety   . Osteopenia 07/2008  . DDD (degenerative disc disease), lumbar 05/2012    had a fall with sslipped disck 06/14/12    Past Surgical History  Procedure Laterality Date  . Abdominal hysterectomy  08/1999    secondary to fibroids  . Toe surgery Left about 2008    cyst removed under left great toe    Family History  Problem Relation Age of Onset  . Cancer - Lung Mother 73  . Cirrhosis Father 29  . Heart failure Maternal Grandmother   . Cancer Paternal Grandmother   . Cancer Paternal Grandfather     Social History History  Substance Use Topics  . Smoking status: Never Smoker   . Smokeless tobacco: Never Used  . Alcohol Use: No    Allergies  Allergen Reactions  . Zetia [Ezetimibe] Rash  . Crestor [Rosuvastatin Calcium] Other (See Comments)    Severe muscle pain at higher dose  . Darvocet [Propoxyphene-Acetaminophen] Other (See Comments)    Doesn't remember reaction  . Lipitor [Atorvastatin Calcium] Other (See Comments)    Severe muscle pain  . Prednisone Other (See Comments)    Weight gain  . Promethazine Other (See Comments)    Pt doesn't remember reaction  . Vicodin  [Hydrocodone-Acetaminophen] Nausea Only  . Zocor [Simvastatin - High Dose] Other (See Comments)    Severe muscle pain  . Amoxicillin-Pot Clavulanate Rash  . Naprosyn [Naproxen] Rash  . Naproxen Sodium Rash    Current Outpatient Prescriptions  Medication Sig Dispense Refill  . aspirin 81 MG tablet Take 81 mg by mouth 2 (two) times daily.      . calcium-vitamin D (CALCIUM + D) 250-125 MG-UNIT per tablet Take 1 tablet by mouth daily.      . celecoxib (CELEBREX) 200 MG capsule Take 200 mg by mouth daily. PRN      . Coenzyme Q10 (COQ10) 100 MG CAPS Take by mouth daily.      . colesevelam (WELCHOL) 625 MG tablet Take 1,875 mg by mouth 2 (two) times daily with a meal.      . Glucosamine-Chondroit-Vit C-Mn (GLUCOSAMINE CHONDR 1500 COMPLX) CAPS Take by mouth daily.      . Loratadine (CLARITIN) 10 MG CAPS Take by mouth as needed.      . Magnesium 400 MG CAPS Take by mouth daily.      . mometasone (NASONEX) 50 MCG/ACT nasal spray Place 2 sprays into the nose daily.      . Multiple Vitamins-Minerals (CENTRUM SPECIALIST HEART PO) Take by mouth 2 (two) times daily.      Marland Kitchen  rosuvastatin (CRESTOR) 10 MG tablet Take 10 mg by mouth 3 (three) times a week.      . vitamin B-12 (CYANOCOBALAMIN) 1000 MCG tablet Take 1,000 mcg by mouth daily.      . vitamin C (ASCORBIC ACID) 500 MG tablet Take 500 mg by mouth daily.      . Vitamin D, Ergocalciferol, (DRISDOL) 50000 UNITS CAPS Take 1 capsule (50,000 Units total) by mouth every 7 (seven) days.  30 capsule  3  . vitamin E 100 UNIT capsule Take 400 Units by mouth daily.       No current facility-administered medications for this visit.    Review of Systems Review of Systems  Constitutional: Negative for fever, chills and unexpected weight change.  HENT: Negative for hearing loss, congestion, sore throat, trouble swallowing and voice change.   Eyes: Negative for visual disturbance.  Respiratory: Positive for cough. Negative for wheezing.   Cardiovascular:  Negative for chest pain, palpitations and leg swelling.  Gastrointestinal: Negative for nausea, vomiting, abdominal pain, diarrhea, constipation, blood in stool, abdominal distention and anal bleeding.  Genitourinary: Negative for hematuria, vaginal bleeding and difficulty urinating.  Musculoskeletal: Positive for arthralgias.  Skin: Negative for rash and wound.  Neurological: Negative for seizures, syncope and headaches.  Hematological: Negative for adenopathy. Does not bruise/bleed easily.  Psychiatric/Behavioral: Negative for confusion.    Blood pressure 130/76, pulse 68, temperature 98 F (36.7 C), temperature source Temporal, resp. rate 16, height 5\' 2"  (1.575 m), weight 138 lb 3.2 oz (62.687 kg).  Physical Exam Physical Exam  Constitutional: She is oriented to person, place, and time. She appears well-developed and well-nourished. No distress.  HENT:  Head: Normocephalic and atraumatic.  Right Ear: External ear normal.  Left Ear: External ear normal.  Nose: Nose normal.  Mouth/Throat: Oropharynx is clear and moist.  Eyes: Conjunctivae are normal. Pupils are equal, round, and reactive to light. Right eye exhibits no discharge. Left eye exhibits no discharge. No scleral icterus.  Neck: Normal range of motion. Neck supple. No tracheal deviation present.  Cardiovascular: Normal rate, regular rhythm, normal heart sounds and intact distal pulses.   No murmur heard. Pulmonary/Chest: Effort normal and breath sounds normal. No respiratory distress. She has no wheezes.  Abdominal: Soft. Bowel sounds are normal. There is tenderness. There is guarding.  She has moderate to severe tenderness in the left upper abdominal quadrant. This was along the costal margin. She guards significantly. I have difficulty palpating a mass secondary to her tenderness. There are no skin changes.  Musculoskeletal: Normal range of motion. She exhibits no edema and no tenderness.  Neurological: She is alert and  oriented to person, place, and time.  Skin: Skin is warm and dry. No rash noted. She is not diaphoretic. No erythema.  Psychiatric: Her behavior is normal. Judgment normal.    Data Reviewed   Assessment    Left upper quadrant abdominal pain and tenderness of uncertain etiology     Plan    I believe that She needs a CAT scan of the abdomen and pelvis to evaluate this area better to see if there is indeed a abdominal wall or intramuscular mass or something involving the rib at the costal margin that could be causing this pain and tenderness. I will see her back after the CAT scan is completed to see if surgical intervention is necessary       Chyanne Kohut A 11/19/2012, 2:15 PM

## 2012-11-20 ENCOUNTER — Ambulatory Visit
Admission: RE | Admit: 2012-11-20 | Discharge: 2012-11-20 | Disposition: A | Discharge status: Home / Self Care | Payer: Medicare Other | Source: Ambulatory Visit | Attending: Surgery | Admitting: Surgery

## 2012-11-20 DIAGNOSIS — R10819 Abdominal tenderness, unspecified site: Secondary | ICD-10-CM

## 2012-11-20 DIAGNOSIS — R1012 Left upper quadrant pain: Secondary | ICD-10-CM

## 2012-11-20 MED ORDER — IOHEXOL 300 MG/ML  SOLN
100.0000 mL | Freq: Once | INTRAMUSCULAR | Status: AC | PRN
  Administered 2012-11-20: 100 mL via INTRAVENOUS

## 2012-11-25 ENCOUNTER — Ambulatory Visit (INDEPENDENT_AMBULATORY_CARE_PROVIDER_SITE_OTHER): Payer: Medicare Other | Admitting: Surgery

## 2012-11-25 ENCOUNTER — Encounter (INDEPENDENT_AMBULATORY_CARE_PROVIDER_SITE_OTHER): Payer: Self-pay | Admitting: Surgery

## 2012-11-25 VITALS — BP 128/68 | HR 72 | Temp 98.0°F | Resp 14 | Ht 62.0 in | Wt 139.2 lb

## 2012-11-25 DIAGNOSIS — R1012 Left upper quadrant pain: Secondary | ICD-10-CM

## 2012-11-25 NOTE — Progress Notes (Signed)
Subjective:     Patient ID: Rebecca Rasmussen, female   DOB: 1945-12-01, 67 y.o.   MRN: [id number]  HPI She is here today for reevaluation of her left upper quadrant abdominal discomfort. She reports that she still has intermittent discomfort in the left upper quadrant. It is mild in intensity.  Review of Systems     Objective:   Physical Exam On exam, again she as well in appearance. I cannot palpate any mass in her abdominal wall or any hernia defect on physical examination. She is nontender today.  The CAT scan of the abdomen and pelvis was unremarkable. There were no subcutaneous masses, intra-abdominal masses, or hernia defects    Assessment:     Left upper quadrant abdominal pain of uncertain etiology     Plan:     I suspect this is just musculoskeletal in nature.  There is nothing further I can offer her  From a general surgical standpoint. I would recommend intermittent heat and ice. She may followup with her primary care physician. I will see her as needed

## 2013-01-14 ENCOUNTER — Other Ambulatory Visit: Payer: Self-pay | Admitting: *Deleted

## 2013-01-14 DIAGNOSIS — E782 Mixed hyperlipidemia: Secondary | ICD-10-CM

## 2013-01-29 ENCOUNTER — Telehealth: Payer: Self-pay | Admitting: *Deleted

## 2013-01-29 NOTE — Telephone Encounter (Signed)
Patient requests crestor samples. Is that ok for this patient, and if so what dose is she on? Thanks, MI

## 2013-01-29 NOTE — Telephone Encounter (Signed)
Yes it is ok to provided patient with samples. Taking Crestor 10 MG Tablet 1 tablet four times a week on Monday/Wednesday/Friday/Sat

## 2013-01-30 NOTE — Telephone Encounter (Signed)
Patient aware that they will be left at the front desk for pick up.

## 2013-02-27 ENCOUNTER — Other Ambulatory Visit (INDEPENDENT_AMBULATORY_CARE_PROVIDER_SITE_OTHER): Payer: 59

## 2013-02-27 DIAGNOSIS — E782 Mixed hyperlipidemia: Secondary | ICD-10-CM

## 2013-02-27 LAB — HEPATIC FUNCTION PANEL
ALK PHOS: 54 U/L (ref 39–117)
ALT: 28 U/L (ref 0–35)
AST: 26 U/L (ref 0–37)
Albumin: 4.5 g/dL (ref 3.5–5.2)
BILIRUBIN DIRECT: 0 mg/dL (ref 0.0–0.3)
BILIRUBIN TOTAL: 0.6 mg/dL (ref 0.3–1.2)
Total Protein: 7.1 g/dL (ref 6.0–8.3)

## 2013-02-27 LAB — LIPID PANEL
CHOL/HDL RATIO: 5
Cholesterol: 200 mg/dL (ref 0–200)
HDL: 39.1 mg/dL (ref >39.00)
LDL Cholesterol: 142 mg/dL — ABNORMAL HIGH (ref 0–99)
TRIGLYCERIDES: 97 mg/dL (ref 0.0–149.0)
VLDL: 19.4 mg/dL (ref 0.0–40.0)

## 2013-03-10 ENCOUNTER — Telehealth: Payer: Self-pay | Admitting: *Deleted

## 2013-03-10 NOTE — Telephone Encounter (Signed)
Patient called requesting crestor samples. She is aware that they will be left at the front desk for pick up.

## 2013-05-07 ENCOUNTER — Telehealth: Payer: Self-pay | Admitting: *Deleted

## 2013-05-07 NOTE — Telephone Encounter (Signed)
Patient requests crestor samples. They will be at the front desk for pick up.

## 2013-06-23 ENCOUNTER — Other Ambulatory Visit: Payer: Self-pay | Admitting: Family Medicine

## 2013-06-23 DIAGNOSIS — E559 Vitamin D deficiency, unspecified: Secondary | ICD-10-CM

## 2013-06-26 ENCOUNTER — Telehealth: Payer: Self-pay | Admitting: *Deleted

## 2013-06-26 NOTE — Telephone Encounter (Signed)
Patient called for crestor samples. I will place at the front for pick up.

## 2013-06-29 ENCOUNTER — Ambulatory Visit
Admission: RE | Admit: 2013-06-29 | Discharge: 2013-06-29 | Disposition: A | Discharge status: Home / Self Care | Payer: Medicare Other | Source: Ambulatory Visit | Attending: Family Medicine | Admitting: Family Medicine

## 2013-06-29 ENCOUNTER — Ambulatory Visit (INDEPENDENT_AMBULATORY_CARE_PROVIDER_SITE_OTHER): Payer: Medicare Other | Admitting: Nurse Practitioner

## 2013-06-29 ENCOUNTER — Encounter: Payer: Self-pay | Admitting: Nurse Practitioner

## 2013-06-29 VITALS — BP 130/76 | HR 64 | Ht 62.0 in | Wt 133.0 lb

## 2013-06-29 DIAGNOSIS — Z0000 Encounter for general adult medical examination without abnormal findings: Secondary | ICD-10-CM

## 2013-06-29 DIAGNOSIS — E559 Vitamin D deficiency, unspecified: Secondary | ICD-10-CM

## 2013-06-29 DIAGNOSIS — Z01419 Encounter for gynecological examination (general) (routine) without abnormal findings: Secondary | ICD-10-CM

## 2013-06-29 LAB — POCT URINALYSIS DIPSTICK
BILIRUBIN UA: NEGATIVE
Glucose, UA: NEGATIVE
KETONES UA: NEGATIVE
Leukocytes, UA: NEGATIVE
Nitrite, UA: NEGATIVE
Protein, UA: NEGATIVE
RBC UA: NEGATIVE
Urobilinogen, UA: NEGATIVE
pH, UA: 5

## 2013-06-29 MED ORDER — VITAMIN D (ERGOCALCIFEROL) 1.25 MG (50000 UNIT) PO CAPS: 50000.0000 [IU] | ORAL_CAPSULE | ORAL | Status: DC

## 2013-06-29 NOTE — Patient Instructions (Signed)

## 2013-06-29 NOTE — Progress Notes (Signed)
Patient ID: Rebecca Rasmussen, female   DOB: 1945/11/29, 68 y.o.   MRN: [id number] 68 y.o. G0P0 Married African American Fe here for annual exam.  Still having headaches that are intermittent and seem to be associated with a hot flash.  After a few seconds to minutes that are gone and does not require med's.  They are less severe - both the headaches and vaso symptoms.  Patient's last menstrual period was 08/22/1999.          Sexually active: no  The current method of family planning is abstinence.    Exercising: yes  Home exercise routine includes walking. Smoker:  no  Health Maintenance: Pap:  05/07/2001 normal   MMG:  11/06/12, Bi-Rads 1: negative Colonoscopy:  04/2008 some diverticula and repeat in 5 years BMD:   07/2008 T Score: spine -1.5; Left hip neck -1.4; appt earlier today for BMD[facility] TDaP:  06/07/2011 Labs:  HB:  PCP   Urine:  Negative    reports that she has never smoked. She has never used smokeless tobacco. She reports that she does not drink alcohol or use illicit drugs.  Past Medical History  Diagnosis Date  . Osteoporosis     osteopenia  . Menopause 2001    Took ERT 08/1999 - 2/ 2007  . Atrophic vaginitis   . Anxiety   . Osteopenia 07/2008  . DDD (degenerative disc disease), lumbar 05/2012    had a fall with sslipped disck 06/14/12    Past Surgical History  Procedure Laterality Date  . Abdominal hysterectomy  08/1999    secondary to fibroids  . Toe surgery Left about 2008    cyst removed under left great toe    Current Outpatient Prescriptions  Medication Sig Dispense Refill  . aspirin 81 MG tablet Take 81 mg by mouth 2 (two) times daily.      . calcium-vitamin D (CALCIUM + D) 250-125 MG-UNIT per tablet Take 1 tablet by mouth daily.      . celecoxib (CELEBREX) 200 MG capsule Take 200 mg by mouth daily. PRN      . Coenzyme Q10 (COQ10) 100 MG CAPS Take by mouth daily.      . cycloSPORINE (RESTASIS) 0.05 % ophthalmic emulsion Place 1 drop into both eyes 2  (two) times daily.      . Glucosamine-Chondroit-Vit C-Mn (GLUCOSAMINE CHONDR 1500 COMPLX) CAPS Take by mouth daily.      . Loratadine (CLARITIN) 10 MG CAPS Take by mouth as needed.      . Magnesium 400 MG CAPS Take by mouth daily.      . mometasone (NASONEX) 50 MCG/ACT nasal spray Place 2 sprays into the nose daily.      . Multiple Vitamins-Minerals (CENTRUM SPECIALIST HEART PO) Take by mouth 2 (two) times daily.      . Omega 3 1000 MG CAPS Take 1 capsule by mouth daily.      . rosuvastatin (CRESTOR) 10 MG tablet Take 10 mg by mouth 3 (three) times a week.      . vitamin B-12 (CYANOCOBALAMIN) 1000 MCG tablet Take 1,000 mcg by mouth daily.      . vitamin C (ASCORBIC ACID) 500 MG tablet Take 500 mg by mouth daily.      . Vitamin D, Ergocalciferol, (DRISDOL) 50000 UNITS CAPS capsule Take 1 capsule (50,000 Units total) by mouth every 7 (seven) days.  30 capsule  3  . vitamin E 100 UNIT capsule Take 400 Units by mouth  daily.       No current facility-administered medications for this visit.    Family History  Problem Relation Age of Onset  . Cancer - Lung Mother 4879  . Cirrhosis Father 4860  . Heart failure Maternal Grandmother   . Cancer Paternal Grandmother   . Cancer Paternal Grandfather     ROS:  Pertinent items are noted in HPI.  Otherwise, a comprehensive ROS was negative.  Exam:   BP 130/76  Pulse 64  Ht 5\' 2"  (1.575 m)  Wt 133 lb (60.328 kg)  BMI 24.32 kg/m2  LMP 08/22/1999 Height: 5\' 2"  (157.5 cm)  Ht Readings from Last 3 Encounters:  06/29/13 5\' 2"  (1.575 m)  11/25/12 5\' 2"  (1.575 m)  11/19/12 5\' 2"  (1.575 m)    General appearance: alert, cooperative and appears stated age Head: Normocephalic, without obvious abnormality, atraumatic Neck: no adenopathy, supple, symmetrical, trachea midline and thyroid normal to inspection and palpation Lungs: clear to auscultation bilaterally Breasts: normal appearance, no masses or tenderness Heart: regular rate and rhythm Abdomen:  soft, non-tender; no masses,  no organomegaly Extremities: extremities normal, atraumatic, no cyanosis or edema Skin: Skin color, texture, turgor normal. No rashes or lesions Lymph nodes: Cervical, supraclavicular, and axillary nodes normal. No abnormal inguinal nodes palpated Neurologic: Grossly normal   Pelvic: External genitalia:  no lesions              Urethra:  normal appearing urethra with no masses, tenderness or lesions              Bartholin's and Skene's: normal                 Vagina: normal appearing vagina with normal color and discharge, no lesions              Cervix: absent              Pap taken: no Bimanual Exam:  Uterus:  uterus absent              Adnexa: no mass, fullness, tenderness               Rectovaginal: Confirms               Anus:  normal sphincter tone, no lesions  A:  Well Woman with normal exam  S/P TAH /BSO 08/22/1999 secondary to fibroids  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

## 2013-06-30 ENCOUNTER — Telehealth: Payer: Self-pay | Admitting: Emergency Medicine

## 2013-06-30 LAB — VITAMIN D 25 HYDROXY (VIT D DEFICIENCY, FRACTURES): Vit D, 25-Hydroxy: 59 ng/mL (ref 30–89)

## 2013-06-30 NOTE — Telephone Encounter (Signed)
Message copied by Joeseph AmorFAST, Jayona Mccaig L on Tue Jun 30, 2013  9:01 AM ------      Message from: Ria CommentGRUBB, PATRICIA R      Created: Mon Jun 29, 2013  7:24 PM       French Anaracy this is the patient who needs [facility].  She has had one done there years ago 2002 and again 2010 ?Marland Kitchen.  She is for a 5 year repeat.  Please call and see which MD she saw and then I can place the order or you can. thanks ------

## 2013-06-30 NOTE — Telephone Encounter (Signed)
Pt notified of Vitamin D results.  Also notified of last colonoscopy date and she does not need colonoscopy until 2016.  Pt voices understanding and is agreeable.    Routing to provider for final review.  Pt agreeable to disposition.  Closing encounter.

## 2013-06-30 NOTE — Telephone Encounter (Signed)
[facility] -Phone: [phone]  They state patient last had colonoscopy 05/06/2009 with Dr. Charlott RakesVincent Schooler, [facility].

## 2013-06-30 NOTE — Telephone Encounter (Signed)
I have attempted to contact this patient by phone with the following results: left message to return my call on answering machine (home per DPR).  

## 2013-06-30 NOTE — Telephone Encounter (Signed)
Message copied by Luisa DagoPHILLIPS, Jarion Hawthorne C on Tue Jun 30, 2013 10:43 AM ------      Message from: Ria CommentGRUBB, PATRICIA R      Created: Tue Jun 30, 2013  9:00 AM       Let patient know Vit D level and follow protocol ------

## 2013-06-30 NOTE — Telephone Encounter (Signed)
Message left to return call to Shanisha Lech at [phone].    

## 2013-07-01 NOTE — Progress Notes (Signed)
Encounter reviewed by Dr. Moiz Ryant Silva.  

## 2013-07-06 ENCOUNTER — Telehealth: Payer: Self-pay | Admitting: Nurse Practitioner

## 2013-07-06 NOTE — Telephone Encounter (Signed)
I also went back and reviewed BMD and it is normal and OK to repeat in 5 years. Let patient know

## 2013-07-06 NOTE — Telephone Encounter (Signed)
Spoke with patient. Advised results from BMD on 5/11 are available in patient's chart for Rebecca FranklinPatricia Rolen-Grubb, FNP to review. Patient states that she was told by PCP that everything was normal and she will need another BMD in 5 years. Patient states she would just like Patty to know and was making sure that we had a copy.   Routing to provider for final review. Patient agreeable to disposition. Will close encounter

## 2013-07-06 NOTE — Telephone Encounter (Signed)
Pt calling to check if Rebecca Rasmussen received her bone density results from her primary care doctor.

## 2013-07-07 NOTE — Telephone Encounter (Signed)
Message left at [phone] per DPR of Patty's suggestions.  Advised to call office with any questions.

## 2013-09-03 ENCOUNTER — Encounter: Payer: Self-pay | Admitting: Cardiology

## 2013-09-17 ENCOUNTER — Telehealth: Payer: Self-pay | Admitting: *Deleted

## 2013-09-17 NOTE — Telephone Encounter (Signed)
Crestor samples placed at the front desk for pick up. 

## 2013-09-28 ENCOUNTER — Ambulatory Visit (INDEPENDENT_AMBULATORY_CARE_PROVIDER_SITE_OTHER): Payer: Medicare Other

## 2013-09-28 ENCOUNTER — Ambulatory Visit (INDEPENDENT_AMBULATORY_CARE_PROVIDER_SITE_OTHER): Payer: Medicare Other | Admitting: Family Medicine

## 2013-09-28 VITALS — BP 134/82 | HR 67 | Temp 97.8°F | Resp 16 | Ht 62.5 in | Wt 136.2 lb

## 2013-09-28 DIAGNOSIS — M25511 Pain in right shoulder: Secondary | ICD-10-CM

## 2013-09-28 DIAGNOSIS — M7541 Impingement syndrome of right shoulder: Secondary | ICD-10-CM

## 2013-09-28 DIAGNOSIS — M67919 Unspecified disorder of synovium and tendon, unspecified shoulder: Secondary | ICD-10-CM

## 2013-09-28 DIAGNOSIS — M719 Bursopathy, unspecified: Secondary | ICD-10-CM

## 2013-09-28 DIAGNOSIS — M25519 Pain in unspecified shoulder: Secondary | ICD-10-CM

## 2013-09-28 MED ORDER — METHYLPREDNISOLONE ACETATE 80 MG/ML IJ SUSP: 80.0000 mg | Freq: Once | INTRAMUSCULAR | Status: DC

## 2013-09-28 NOTE — Progress Notes (Signed)
 Chief Complaint:  Chief Complaint  Patient presents with  . Shoulder Pain    x4 days right shoulder pain.  OTC Ibuprofen is used with little relief as well as hot compress.  Movement causes more pain and pain radiates towards the right side of her neck    HPI: Rebecca Rasmussen is a 68 y.o. female who is here for  8-10/10 Right shoulder pain, she has had it for 2 days, OCcured after [place] form beach. NKI. SHe has never had this before. Excrutiating pain when sh tried to lift it above shoudler height. She can;t brush her hair or put her bra on regular, she fastens it in the front and slides it around. She is already on celebrex, she has ahd a hx of frzen shoulder, she has ahd steroid injections in the past without adverse effects, just oral steroids make her gain weight.   Past Medical History  Diagnosis Date  . Osteoporosis     osteopenia  . Menopause 2001    Took ERT 08/1999 - 2/ 2007  . Atrophic vaginitis   . Anxiety   . Osteopenia 07/2008  . DDD (degenerative disc disease), lumbar 05/2012    had a fall with sslipped disck 06/14/12  . Premature ovarian failure age 68   Past Surgical History  Procedure Laterality Date  . Abdominal hysterectomy  08/1999    secondary to fibroids  . Toe surgery Left about 2008    cyst removed under left great toe  . Eye surgery    . Appendectomy     History   Social History  . Marital Status: Married    Spouse Name: N/A    Number of Children: N/A  . Years of Education: N/A   Social History Main Topics  . Smoking status: Never Smoker   . Smokeless tobacco: Never Used  . Alcohol Use: No  . Drug Use: No  . Sexual Activity: No   Other Topics Concern  . None   Social History Narrative  . None   Family History  Problem Relation Age of Onset  . Cancer - Lung Mother 4279  . Cirrhosis Father 5560  . Heart failure Maternal Grandmother   . Cancer Paternal Grandmother   . Cancer Paternal Grandfather    Allergies  Allergen  Reactions  . Zetia [Ezetimibe] Rash  . Crestor [Rosuvastatin Calcium] Other (See Comments)    Severe muscle pain at higher dose  . Darvocet [Propoxyphene N-Acetaminophen] Other (See Comments)    Doesn't remember reaction  . Lipitor [Atorvastatin Calcium] Other (See Comments)    Severe muscle pain  . Prednisone Other (See Comments)    Weight gain  . Promethazine Other (See Comments)    Pt doesn't remember reaction  . Vicodin [Hydrocodone-Acetaminophen] Nausea Only  . Zocor [Simvastatin - High Dose] Other (See Comments)    Severe muscle pain  . Amoxicillin-Pot Clavulanate Rash  . Naprosyn [Naproxen] Rash  . Naproxen Sodium Rash   Prior to Admission medications   Medication Sig Start Date End Date Taking? Authorizing Provider  aspirin 81 MG tablet Take 81 mg by mouth 2 (two) times daily.   Yes Historical Provider, MD  calcium-vitamin D (CALCIUM + D) 250-125 MG-UNIT per tablet Take 1 tablet by mouth daily.   Yes Historical Provider, MD  celecoxib (CELEBREX) 200 MG capsule Take 200 mg by mouth daily. PRN   Yes Historical Provider, MD  Coenzyme Q10 (COQ10) 100 MG CAPS Take by mouth  daily.   Yes Historical Provider, MD  cycloSPORINE (RESTASIS) 0.05 % ophthalmic emulsion Place 1 drop into both eyes 2 (two) times daily.   Yes Historical Provider, MD  Glucosamine-Chondroit-Vit C-Mn (GLUCOSAMINE CHONDR 1500 COMPLX) CAPS Take by mouth daily.   Yes Historical Provider, MD  Loratadine (CLARITIN) 10 MG CAPS Take by mouth as needed.   Yes Historical Provider, MD  Magnesium 400 MG CAPS Take by mouth daily.   Yes Historical Provider, MD  mometasone (NASONEX) 50 MCG/ACT nasal spray Place 2 sprays into the nose daily.   Yes Historical Provider, MD  Multiple Vitamins-Minerals (CENTRUM SPECIALIST HEART PO) Take by mouth 2 (two) times daily.   Yes Historical Provider, MD  Omega 3 1000 MG CAPS Take 1 capsule by mouth daily.   Yes Historical Provider, MD  rosuvastatin (CRESTOR) 10 MG tablet Take 10 mg by  mouth 3 (three) times a week.   Yes Historical Provider, MD  vitamin B-12 (CYANOCOBALAMIN) 1000 MCG tablet Take 1,000 mcg by mouth daily.   Yes Historical Provider, MD  vitamin C (ASCORBIC ACID) 500 MG tablet Take 500 mg by mouth daily.   Yes Historical Provider, MD  Vitamin D, Ergocalciferol, (DRISDOL) 50000 UNITS CAPS capsule Take 1 capsule (50,000 Units total) by mouth every 7 (seven) days. 06/29/13  Yes [name], FNP  vitamin E 100 UNIT capsule Take 400 Units by mouth daily.   Yes Historical Provider, MD     ROS: The patient denies fevers, chills, night sweats, unintentional weight loss, chest pain, palpitations, wheezing, dyspnea on exertion, nausea, vomiting, abdominal pain, dysuria, hematuria, melena, numbness, weakness, or tingling.   All other systems have been reviewed and were otherwise negative with the exception of those mentioned in the HPI and as above.    PHYSICAL EXAM: Filed Vitals:   09/28/13 1125  BP: 134/82  Pulse: 67  Temp: 97.8 F (36.6 C)  Resp: 16   Filed Vitals:   09/28/13 1125  Height: 5' 2.5" (1.588 m)  Weight: 136 lb 3.2 oz (61.78 kg)   Body mass index is 24.5 kg/(m^2).  General: Alert, no acute distress HEENT:  Normocephalic, atraumatic, oropharynx patent. EOMI, PERRLA Cardiovascular:  Regular rate and rhythm, no rubs murmurs or gallops.  No Carotid bruits, radial pulse intact. No pedal edema.  Respiratory: Clear to auscultation bilaterally.  No wheezes, rales, or rhonchi.  No cyanosis, no use of accessory musculature GI: No organomegaly, abdomen is soft and non-tender, positive bowel sounds.  No masses. Skin: No rashes. Neurologic: Facial musculature symmetric. Psychiatric: Patient is appropriate throughout our interaction. Lymphatic: No cervical lymphadenopathy Musculoskeletal: Gait intact. Neg neck exam-neg spurling Right shoulder-decrease ROM due to pain, + pain with PROM espcially in  ER and IR Neg emtpy can, +/- lift off, +  Hawkins/Neers, neg Speeds or AC jt tenderness 5/5 str, 2/2 DTRs   LABS: Results for orders placed in visit on 06/29/13  VITAMIN D 25 HYDROXY      Result Value Ref Range   Vit D, 25-Hydroxy 59  30 - 89 ng/mL  POCT URINALYSIS DIPSTICK      Result Value Ref Range   Color, UA yellow     Clarity, UA clear     Glucose, UA neg     Bilirubin, UA neg     Ketones, UA neg     Spec Grav, UA       Blood, UA neg     pH, UA 5.0     Protein, UA neg  Urobilinogen, UA negative     Nitrite, UA neg     Leukocytes, UA Negative       EKG/XRAY:   Primary read interpreted by [facility]. Conley Rolls at [facility]. Neg for fractures or dil;ocation + DJD, ostepophytes  ASSESSMENT/PLAN: Encounter Diagnoses  Name Primary?  . Right shoulder pain   . Rotator cuff impingement syndrome of right shoulder Yes   Patient wanted injection, already on celebrex, she states the injectons have helped her in the past , last one was several years ago.  Exercises given VCO for injection No skin erythema or wound noted. Risks (including but not limited to bleeding and infection), benefits, and alternatives discussed for RIGHT shoulder injection. Verbal consent obtained after any questions were answered., and verbal understanding expressed. Landmarks noted, and marked as needed. Area cleansed with Betadine x3, ethyl chloride spray for topical anesthesia, followed by alcohol swab. 18 gauge needle used. Patient was injected with 2 ml of plain lidocaine,  2 ml of marcaine and 80 mg or 1 ml of depomedrol.          Gross sideeffects, risk and benefits, and alternatives of medications d/w patient. Patient is aware that all medications have potential sideeffects and we are unable to predict every sideeffect or drug-drug interaction that may occur.  ,  PHUONG, DO 09/28/2013 1:45 PM

## 2013-09-28 NOTE — Patient Instructions (Signed)
Impingement Syndrome, Rotator Cuff, Bursitis with Rehab Impingement syndrome is a condition that involves inflammation of the tendons of the rotator cuff and the subacromial bursa, that causes pain in the shoulder. The rotator cuff consists of four tendons and muscles that control much of the shoulder and upper arm function. The subacromial bursa is a fluid filled sac that helps reduce friction between the rotator cuff and one of the bones of the shoulder (acromion). Impingement syndrome is usually an overuse injury that causes swelling of the bursa (bursitis), swelling of the tendon (tendonitis), and/or a tear of the tendon (strain). Strains are classified into three categories. Grade 1 strains cause pain, but the tendon is not lengthened. Grade 2 strains include a lengthened ligament, due to the ligament being stretched or partially ruptured. With grade 2 strains there is still function, although the function may be decreased. Grade 3 strains include a complete tear of the tendon or muscle, and function is usually impaired. SYMPTOMS   Pain around the shoulder, often at the outer portion of the upper arm.  Pain that gets worse with shoulder function, especially when reaching overhead or lifting.  Sometimes, aching when not using the arm.  Pain that wakes you up at night.  Sometimes, tenderness, swelling, warmth, or redness over the affected area.  Loss of strength.  Limited motion of the shoulder, especially reaching behind the back (to the back pocket or to unhook bra) or across your body.  Crackling sound (crepitation) when moving the arm.  Biceps tendon pain and inflammation (in the front of the shoulder). Worse when bending the elbow or lifting. CAUSES  Impingement syndrome is often an overuse injury, in which chronic (repetitive) motions cause the tendons or bursa to become inflamed. A strain occurs when a force is paced on the tendon or muscle that is greater than it can withstand.  Common mechanisms of injury include: Stress from sudden increase in duration, frequency, or intensity of training.  Direct hit (trauma) to the shoulder.  Aging, erosion of the tendon with normal use.  Bony bump on shoulder (acromial spur). RISK INCREASES WITH:  Contact sports (football, wrestling, boxing).  Throwing sports (baseball, tennis, volleyball).  Weightlifting and bodybuilding.  Heavy labor.  Previous injury to the rotator cuff, including impingement.  Poor shoulder strength and flexibility.  Failure to warm up properly before activity.  Inadequate protective equipment.  Old age.  Bony bump on shoulder (acromial spur). PREVENTION   Warm up and stretch properly before activity.  Allow for adequate recovery between workouts.  Maintain physical fitness:  Strength, flexibility, and endurance.  Cardiovascular fitness.  Learn and use proper exercise technique. PROGNOSIS  If treated properly, impingement syndrome usually goes away within 6 weeks. Sometimes surgery is required.  RELATED COMPLICATIONS   Longer healing time if not properly treated, or if not given enough time to heal.  Recurring symptoms, that result in a chronic condition.  Shoulder stiffness, frozen shoulder, or loss of motion.  Rotator cuff tendon tear.  Recurring symptoms, especially if activity is resumed too soon, with overuse, with a direct blow, or when using poor technique. TREATMENT  Treatment first involves the use of ice and medicine, to reduce pain and inflammation. The use of strengthening and stretching exercises may help reduce pain with activity. These exercises may be performed at home or with a therapist. If non-surgical treatment is unsuccessful after more than 6 months, surgery may be advised. After surgery and rehabilitation, activity is usually possible in 3 months.  MEDICATION  If pain medicine is needed, nonsteroidal anti-inflammatory medicines (aspirin and  ibuprofen), or other minor pain relievers (acetaminophen), are often advised.  Do not take pain medicine for 7 days before surgery.  Prescription pain relievers may be given, if your caregiver thinks they are needed. Use only as directed and only as much as you need.  Corticosteroid injections may be given by your caregiver. These injections should be reserved for the most serious cases, because they may only be given a certain number of times. HEAT AND COLD  Cold treatment (icing) should be applied for 10 to 15 minutes every 2 to 3 hours for inflammation and pain, and immediately after activity that aggravates your symptoms. Use ice packs or an ice massage.  Heat treatment may be used before performing stretching and strengthening activities prescribed by your caregiver, physical therapist, or athletic trainer. Use a heat pack or a warm water soak. SEEK MEDICAL CARE IF:   Symptoms get worse or do not improve in 4 to 6 weeks, despite treatment.  New, unexplained symptoms develop. (Drugs used in treatment may produce side effects.) EXERCISES  RANGE OF MOTION (ROM) AND STRETCHING EXERCISES - Impingement Syndrome (Rotator Cuff  Tendinitis, Bursitis) These exercises may help you when beginning to rehabilitate your injury. Your symptoms may go away with or without further involvement from your physician, physical therapist or athletic trainer. While completing these exercises, remember:   Restoring tissue flexibility helps normal motion to return to the joints. This allows healthier, less painful movement and activity.  An effective stretch should be held for at least 30 seconds.  A stretch should never be painful. You should only feel a gentle lengthening or release in the stretched tissue. STRETCH - Flexion, Standing  Stand with good posture. With an underhand grip on your right / left hand, and an overhand grip on the opposite hand, grasp a broomstick or cane so that your hands are a  little more than shoulder width apart.  Keeping your right / left elbow straight and shoulder muscles relaxed, push the stick with your opposite hand, to raise your right / left arm in front of your body and then overhead. Raise your arm until you feel a stretch in your right / left shoulder, but before you have increased shoulder pain.  Try to avoid shrugging your right / left shoulder as your arm rises, by keeping your shoulder blade tucked down and toward your mid-back spine. Hold for __________ seconds.  Slowly return to the starting position. Repeat __________ times. Complete this exercise __________ times per day. STRETCH - Abduction, Supine  Lie on your back. With an underhand grip on your right / left hand and an overhand grip on the opposite hand, grasp a broomstick or cane so that your hands are a little more than shoulder width apart.  Keeping your right / left elbow straight and your shoulder muscles relaxed, push the stick with your opposite hand, to raise your right / left arm out to the side of your body and then overhead. Raise your arm until you feel a stretch in your right / left shoulder, but before you have increased shoulder pain.  Try to avoid shrugging your right / left shoulder as your arm rises, by keeping your shoulder blade tucked down and toward your mid-back spine. Hold for ___10_______ seconds.  Slowly return to the starting position. Repeat __10________ times. Complete this exercise ____3______ times per day. ROM - Flexion, Active-Assisted  Lie on your back.  You may bend your knees for comfort.  Grasp a broomstick or cane so your hands are about shoulder width apart. Your right / left hand should grip the end of the stick, so that your hand is positioned "thumbs-up," as if you were about to shake hands.  Using your healthy arm to lead, raise your right / left arm overhead, until you feel a gentle stretch in your shoulder. Hold for __________ seconds.  Use the  stick to assist in returning your right / left arm to its starting position. Repeat __________ times. Complete this exercise __________ times per day.  ROM - Internal Rotation, Supine   Lie on your back on a firm surface. Place your right / left elbow about 60 degrees away from your side. Elevate your elbow with a folded towel, so that the elbow and shoulder are the same height.  Using a broomstick or cane and your strong arm, pull your right / left hand toward your body until you feel a gentle stretch, but no increase in your shoulder pain. Keep your shoulder and elbow in place throughout the exercise.  Hold for __________ seconds. Slowly return to the starting position. Repeat __________ times. Complete this exercise __________ times per day. STRETCH - Internal Rotation  Place your right / left hand behind your back, palm up.  Throw a towel or belt over your opposite shoulder. Grasp the towel with your right / left hand.  While keeping an upright posture, gently pull up on the towel, until you feel a stretch in the front of your right / left shoulder.  Avoid shrugging your right / left shoulder as your arm rises, by keeping your shoulder blade tucked down and toward your mid-back spine.  Hold for __________ seconds. Release the stretch, by lowering your healthy hand. Repeat __________ times. Complete this exercise __________ times per day. ROM - Internal Rotation   Using an underhand grip, grasp a stick behind your back with both hands.  While standing upright with good posture, slide the stick up your back until you feel a mild stretch in the front of your shoulder.  Hold for __________ seconds. Slowly return to your starting position. Repeat __________ times. Complete this exercise __________ times per day.  STRETCH - Posterior Shoulder Capsule   Stand or sit with good posture. Grasp your right / left elbow and draw it across your chest, keeping it at the same height as your  shoulder.  Pull your elbow, so your upper arm comes in closer to your chest. Pull until you feel a gentle stretch in the back of your shoulder.  Hold for __________ seconds. Repeat __________ times. Complete this exercise __________ times per day. STRENGTHENING EXERCISES - Impingement Syndrome (Rotator Cuff Tendinitis, Bursitis) These exercises may help you when beginning to rehabilitate your injury. They may resolve your symptoms with or without further involvement from your physician, physical therapist or athletic trainer. While completing these exercises, remember:  Muscles can gain both the endurance and the strength needed for everyday activities through controlled exercises.  Complete these exercises as instructed by your physician, physical therapist or athletic trainer. Increase the resistance and repetitions only as guided.  You may experience muscle soreness or fatigue, but the pain or discomfort you are trying to eliminate should never worsen during these exercises. If this pain does get worse, stop and make sure you are following the directions exactly. If the pain is still present after adjustments, discontinue the exercise until you can discuss  the trouble with your clinician.  During your recovery, avoid activity or exercises which involve actions that place your injured hand or elbow above your head or behind your back or head. These positions stress the tissues which you are trying to heal. STRENGTH - Scapular Depression and Adduction   With good posture, sit on a firm chair. Support your arms in front of you, with pillows, arm rests, or on a table top. Have your elbows in line with the sides of your body.  Gently draw your shoulder blades down and toward your mid-back spine. Gradually increase the tension, without tensing the muscles along the top of your shoulders and the back of your neck.  Hold for __________ seconds. Slowly release the tension and relax your muscles  completely before starting the next repetition.  After you have practiced this exercise, remove the arm support and complete the exercise in standing as well as sitting position. Repeat __________ times. Complete this exercise __________ times per day.  STRENGTH - Shoulder Abductors, Isometric  With good posture, stand or sit about 4-6 inches from a wall, with your right / left side facing the wall.  Bend your right / left elbow. Gently press your right / left elbow into the wall. Increase the pressure gradually, until you are pressing as hard as you can, without shrugging your shoulder or increasing any shoulder discomfort.  Hold for __________ seconds.  Release the tension slowly. Relax your shoulder muscles completely before you begin the next repetition. Repeat __________ times. Complete this exercise __________ times per day.  STRENGTH - External Rotators, Isometric  Keep your right / left elbow at your side and bend it 90 degrees.  Step into a door frame so that the outside of your right / left wrist can press against the door frame without your upper arm leaving your side.  Gently press your right / left wrist into the door frame, as if you were trying to swing the back of your hand away from your stomach. Gradually increase the tension, until you are pressing as hard as you can, without shrugging your shoulder or increasing any shoulder discomfort.  Hold for __________ seconds.  Release the tension slowly. Relax your shoulder muscles completely before you begin the next repetition. Repeat __________ times. Complete this exercise __________ times per day.  STRENGTH - Supraspinatus   Stand or sit with good posture. Grasp a __________ weight, or an exercise band or tubing, so that your hand is "thumbs-up," like you are shaking hands.  Slowly lift your right / left arm in a "V" away from your thigh, diagonally into the space between your side and straight ahead. Lift your hand to  shoulder height or as far as you can, without increasing any shoulder pain. At first, many people do not lift their hands above shoulder height.  Avoid shrugging your right / left shoulder as your arm rises, by keeping your shoulder blade tucked down and toward your mid-back spine.  Hold for __________ seconds. Control the descent of your hand, as you slowly return to your starting position. Repeat __________ times. Complete this exercise __________ times per day.  STRENGTH - External Rotators  Secure a rubber exercise band or tubing to a fixed object (table, pole) so that it is at the same height as your right / left elbow when you are standing or sitting on a firm surface.  Stand or sit so that the secured exercise band is at your uninjured side.  Bend   your right / left elbow 90 degrees. Place a folded towel or small pillow under your right / left arm, so that your elbow is a few inches away from your side.  Keeping the tension on the exercise band, pull it away from your body, as if pivoting on your elbow. Be sure to keep your body steady, so that the movement is coming only from your rotating shoulder.  Hold for __________ seconds. Release the tension in a controlled manner, as you return to the starting position. Repeat __________ times. Complete this exercise __________ times per day.  STRENGTH - Internal Rotators   Secure a rubber exercise band or tubing to a fixed object (table, pole) so that it is at the same height as your right / left elbow when you are standing or sitting on a firm surface.  Stand or sit so that the secured exercise band is at your right / left side.  Bend your elbow 90 degrees. Place a folded towel or small pillow under your right / left arm so that your elbow is a few inches away from your side.  Keeping the tension on the exercise band, pull it across your body, toward your stomach. Be sure to keep your body steady, so that the movement is coming only from  your rotating shoulder.  Hold for __________ seconds. Release the tension in a controlled manner, as you return to the starting position. Repeat __________ times. Complete this exercise __________ times per day.  STRENGTH - Scapular Protractors, Standing   Stand arms length away from a wall. Place your hands on the wall, keeping your elbows straight.  Begin by dropping your shoulder blades down and toward your mid-back spine.  To strengthen your protractors, keep your shoulder blades down, but slide them forward on your rib cage. It will feel as if you are lifting the back of your rib cage away from the wall. This is a subtle motion and can be challenging to complete. Ask your caregiver for further instruction, if you are not sure you are doing the exercise correctly.  Hold for __________ seconds. Slowly return to the starting position, resting the muscles completely before starting the next repetition. Repeat __________ times. Complete this exercise __________ times per day. STRENGTH - Scapular Protractors, Supine  Lie on your back on a firm surface. Extend your right / left arm straight into the air while holding a __________ weight in your hand.  Keeping your head and back in place, lift your shoulder off the floor.  Hold for __________ seconds. Slowly return to the starting position, and allow your muscles to relax completely before starting the next repetition. Repeat __________ times. Complete this exercise __________ times per day. STRENGTH - Scapular Protractors, Quadruped  Get onto your hands and knees, with your shoulders directly over your hands (or as close as you can be, comfortably).  Keeping your elbows locked, lift the back of your rib cage up into your shoulder blades, so your mid-back rounds out. Keep your neck muscles relaxed.  Hold this position for __________ seconds. Slowly return to the starting position and allow your muscles to relax completely before starting the  next repetition. Repeat __________ times. Complete this exercise __________ times per day.  STRENGTH - Scapular Retractors  Secure a rubber exercise band or tubing to a fixed object (table, pole), so that it is at the height of your shoulders when you are either standing, or sitting on a firm armless chair.  With a   palm down grip, grasp an end of the band in each hand. Straighten your elbows and lift your hands straight in front of you, at shoulder height. Step back, away from the secured end of the band, until it becomes tense.  Squeezing your shoulder blades together, draw your elbows back toward your sides, as you bend them. Keep your upper arms lifted away from your body throughout the exercise.  Hold for __________ seconds. Slowly ease the tension on the band, as you reverse the directions and return to the starting position. Repeat __________ times. Complete this exercise __________ times per day. STRENGTH - Shoulder Extensors   Secure a rubber exercise band or tubing to a fixed object (table, pole) so that it is at the height of your shoulders when you are either standing, or sitting on a firm armless chair.  With a thumbs-up grip, grasp an end of the band in each hand. Straighten your elbows and lift your hands straight in front of you, at shoulder height. Step back, away from the secured end of the band, until it becomes tense.  Squeezing your shoulder blades together, pull your hands down to the sides of your thighs. Do not allow your hands to go behind you.  Hold for __________ seconds. Slowly ease the tension on the band, as you reverse the directions and return to the starting position. Repeat __________ times. Complete this exercise __________ times per day.  STRENGTH - Scapular Retractors and External Rotators   Secure a rubber exercise band or tubing to a fixed object (table, pole) so that it is at the height as your shoulders, when you are either standing, or sitting on a  firm armless chair.  With a palm down grip, grasp an end of the band in each hand. Bend your elbows 90 degrees and lift your elbows to shoulder height, at your sides. Step back, away from the secured end of the band, until it becomes tense.  Squeezing your shoulder blades together, rotate your shoulders so that your upper arms and elbows remain stationary, but your fists travel upward to head height.  Hold for __________ seconds. Slowly ease the tension on the band, as you reverse the directions and return to the starting position. Repeat __________ times. Complete this exercise __________ times per day.  STRENGTH - Scapular Retractors and External Rotators, Rowing   Secure a rubber exercise band or tubing to a fixed object (table, pole) so that it is at the height of your shoulders, when you are either standing, or sitting on a firm armless chair.  With a palm down grip, grasp an end of the band in each hand. Straighten your elbows and lift your hands straight in front of you, at shoulder height. Step back, away from the secured end of the band, until it becomes tense.  Step 1: Squeeze your shoulder blades together. Bending your elbows, draw your hands to your chest, as if you are rowing a boat. At the end of this motion, your hands and elbow should be at shoulder height and your elbows should be out to your sides.  Step 2: Rotate your shoulders, to raise your hands above your head. Your forearms should be vertical and your upper arms should be horizontal.  Hold for __________ seconds. Slowly ease the tension on the band, as you reverse the directions and return to the starting position. Repeat __________ times. Complete this exercise __________ times per day.  STRENGTH - Scapular Depressors  Find a sturdy chair   without wheels, such as a dining room chair.  Keeping your feet on the floor, and your hands on the chair arms, lift your bottom up from the seat, and lock your elbows.  Keeping  your elbows straight, allow gravity to pull your body weight down. Your shoulders will rise toward your ears.  Raise your body against gravity by drawing your shoulder blades down your back, shortening the distance between your shoulders and ears. Although your feet should always maintain contact with the floor, your feet should progressively support less body weight, as you get stronger.  Hold for __________ seconds. In a controlled and slow manner, lower your body weight to begin the next repetition. Repeat __________ times. Complete this exercise __________ times per day.  Document Released: 02/05/2005 Document Revised: 04/30/2011 Document Reviewed: 05/20/2008 [facility] Patient Information 2015 [place], [place]. This information is not intended to replace advice given to you by your health care provider. Make sure you discuss any questions you have with your health care provider.

## 2013-10-09 ENCOUNTER — Telehealth: Payer: Self-pay | Admitting: *Deleted

## 2013-10-09 NOTE — Telephone Encounter (Signed)
Crestor samples put back in sample closet.

## 2013-10-15 ENCOUNTER — Encounter: Payer: Self-pay | Admitting: Cardiology

## 2013-10-15 DIAGNOSIS — E288 Other ovarian dysfunction: Secondary | ICD-10-CM

## 2013-10-15 DIAGNOSIS — E2839 Other primary ovarian failure: Secondary | ICD-10-CM | POA: Insufficient documentation

## 2013-10-15 DIAGNOSIS — N952 Postmenopausal atrophic vaginitis: Secondary | ICD-10-CM | POA: Insufficient documentation

## 2013-10-15 DIAGNOSIS — M810 Age-related osteoporosis without current pathological fracture: Secondary | ICD-10-CM | POA: Insufficient documentation

## 2013-10-15 DIAGNOSIS — F419 Anxiety disorder, unspecified: Secondary | ICD-10-CM | POA: Insufficient documentation

## 2013-10-15 DIAGNOSIS — Z780 Asymptomatic menopausal state: Secondary | ICD-10-CM | POA: Insufficient documentation

## 2013-10-23 ENCOUNTER — Ambulatory Visit: Payer: Medicare Other | Admitting: Cardiology

## 2013-10-28 ENCOUNTER — Encounter: Payer: Self-pay | Admitting: Cardiology

## 2013-10-28 ENCOUNTER — Ambulatory Visit (INDEPENDENT_AMBULATORY_CARE_PROVIDER_SITE_OTHER): Payer: Medicare Other | Admitting: Cardiology

## 2013-10-28 VITALS — BP 128/72 | HR 82 | Ht 62.5 in | Wt 135.0 lb

## 2013-10-28 DIAGNOSIS — Z789 Other specified health status: Secondary | ICD-10-CM | POA: Insufficient documentation

## 2013-10-28 DIAGNOSIS — R0789 Other chest pain: Secondary | ICD-10-CM

## 2013-10-28 DIAGNOSIS — R6884 Jaw pain: Secondary | ICD-10-CM

## 2013-10-28 DIAGNOSIS — E7800 Pure hypercholesterolemia, unspecified: Secondary | ICD-10-CM

## 2013-10-28 DIAGNOSIS — E785 Hyperlipidemia, unspecified: Secondary | ICD-10-CM | POA: Insufficient documentation

## 2013-10-28 NOTE — Progress Notes (Signed)
[address] [place], [place]  [address] Phone: [phone] Fax:  [phone]  Date:  10/28/2013   ID:  [name], [name] [birth year], MRN [id number]  PCP:  Hollice Espy, MD   History of Present Illness: Rebecca Rasmussen is a 68 y.o. female with hyperlipidemia, difficult to control with prior myalgias.  On 02/27/13-LDL 142, triglycerides 97, ALT 28. Prior lipid panel showed an LDL of 163, triglycerides of 190. She has been tolerating Crestor Monday Wednesday and Friday, 3 times a week. No myalgias. Doing well.  She has however noted occasional intermittent chest discomfort, pressure-like at times when standing in the kitchen or sitting, nonexertional that radiates to her jaw with no associated shortness of breath or diaphoresis. Duration can be several minutes, non-fleeting. She has not noted chest discomfort when walking. Nonsmoker.  She still battles with hot flashes, headaches, post menopausal symptoms.   Wt Readings from Last 3 Encounters:  10/28/13 135 lb (61.236 kg)  09/28/13 136 lb 3.2 oz (61.78 kg)  06/29/13 133 lb (60.328 kg)     Past Medical History  Diagnosis Date  . Osteoporosis     osteopenia  . Menopause 2001    Took ERT 08/1999 - 2/ 2007  . Atrophic vaginitis   . Anxiety   . Osteopenia 07/2008  . DDD (degenerative disc disease), lumbar 05/2012    had a fall with sslipped disck 06/14/12  . Premature ovarian failure age 31    Past Surgical History  Procedure Laterality Date  . Abdominal hysterectomy  08/1999    secondary to fibroids  . Toe surgery Left about 2008    cyst removed under left great toe  . Eye surgery    . Appendectomy      Current Outpatient Prescriptions  Medication Sig Dispense Refill  . aspirin 81 MG tablet Take 81 mg by mouth 2 (two) times daily.      . calcium-vitamin D (CALCIUM + D) 250-125 MG-UNIT per tablet Take 1 tablet by mouth daily.      . celecoxib (CELEBREX) 200 MG capsule Take 200 mg by mouth daily. PRN       . Coenzyme Q10 (COQ10) 100 MG CAPS Take by mouth 3 (three) times a week.       . cycloSPORINE (RESTASIS) 0.05 % ophthalmic emulsion Place 1 drop into both eyes 2 (two) times daily.      . Glucosamine-Chondroit-Vit C-Mn (GLUCOSAMINE CHONDR 1500 COMPLX) CAPS Take by mouth daily.      . Loratadine (CLARITIN) 10 MG CAPS Take by mouth as needed.      . Magnesium 400 MG CAPS Take by mouth daily.      . mometasone (NASONEX) 50 MCG/ACT nasal spray Place 2 sprays into the nose daily.      . Multiple Vitamins-Minerals (CENTRUM SPECIALIST HEART PO) Take by mouth 2 (two) times daily.      . Omega 3 1000 MG CAPS Take 1 capsule by mouth daily.      . rosuvastatin (CRESTOR) 10 MG tablet Take 10 mg by mouth 3 (three) times a week.      . vitamin B-12 (CYANOCOBALAMIN) 1000 MCG tablet Take 1,000 mcg by mouth daily.      . vitamin C (ASCORBIC ACID) 500 MG tablet Take 500 mg by mouth daily.      . Vitamin D, Ergocalciferol, (DRISDOL) 50000 UNITS CAPS capsule Take 1 capsule (50,000 Units total) by mouth every 7 (seven) days.  30 capsule  3  . vitamin E 100 UNIT capsule Take 400 Units by mouth daily.       Current Facility-Administered Medications  Medication Dose Route Frequency Provider Last Rate Last Dose  . methylPREDNISolone acetate (DEPO-MEDROL) injection 80 mg  80 mg Intramuscular Once Thao P Le, DO        Allergies:    Allergies  Allergen Reactions  . Zetia [Ezetimibe] Rash  . Crestor [Rosuvastatin Calcium] Other (See Comments)    Severe muscle pain at higher dose  . Darvocet [Propoxyphene N-Acetaminophen] Other (See Comments)    Doesn't remember reaction  . Lipitor [Atorvastatin Calcium] Other (See Comments)    Severe muscle pain  . Prednisone Other (See Comments)    Weight gain  . Promethazine Other (See Comments)    Pt doesn't remember reaction  . Vicodin [Hydrocodone-Acetaminophen] Nausea Only  . Zocor [Simvastatin - High Dose] Other (See Comments)    Severe muscle pain  .  Amoxicillin-Pot Clavulanate Rash  . Naprosyn [Naproxen] Rash  . Naproxen Sodium Rash    Social History:  The patient  reports that she has never smoked. She has never used smokeless tobacco. She reports that she does not drink alcohol or use illicit drugs.   Family History  Problem Relation Age of Onset  . Cancer - Lung Mother 45  . Cirrhosis Father 8  . Heart failure Maternal Grandmother   . Cancer Paternal Grandmother   . Cancer Paternal Grandfather     ROS:  Please see the history of present illness.   Denies any fevers, chills, orthopnea, PND   All other systems reviewed and negative.   PHYSICAL EXAM: VS:  BP 128/72  Pulse 82  Ht 5' 2.5" (1.588 m)  Wt 135 lb (61.236 kg)  BMI 24.28 kg/m2  LMP 08/22/1999 Well nourished, well developed, in no acute distress HEENT: normal, [place]/AT, EOMI Neck: no JVD, normal carotid upstroke, no bruit Cardiac:  normal S1, S2; RRR; no murmur Lungs:  clear to auscultation bilaterally, no wheezing, rhonchi or rales Abd: soft, nontender, no hepatomegaly, no bruits Ext: no edema, 2+ distal pulses Skin: warm and dry GU: deferred Neuro: no focal abnormalities noted, AAO x 3  EKG:  10/28/13-sinus rhythm, 82, peak T waves in lead to, right atrial enlargement, no ST segment changes.     ASSESSMENT AND PLAN:  1. Chest pain-intermittent chest pain usually at rest or while standing in the kitchen lasting a few minutes duration, radiating to jaw with no associated shortness of breath. She does occasionally have hot flashes but this is associated with her postmenopausal symptoms. Given her long-standing history of hyperlipidemia, age we will proceed with nuclear stress test. 2. Hyperlipidemia-LDL currently 142. It has been much higher in the past. She has been only able to tolerate Crestor 3 days a week. She is doing well with this. No longer on WelChol. Continue with fish oil. 3. Statin intolerance-she's had issues with myalgias in the past with higher doses  of statin, more frequent doses. Doing well on current 3 days a week Crestor. 4. One-year followup.  Signed, Donato Schultz, MD [facility]  10/28/2013 9:31 AM

## 2013-10-28 NOTE — Patient Instructions (Signed)
The current medical regimen is effective;  continue present plan and medications.  Your physician has requested that you have a myoview. For further information please visit [email or web address] Please follow instruction sheet, as given.  Follow up in 1 year with Dr. Anne Fu.  You will receive a letter in the mail 2 months before you are due.  Please call us when you receive this letter to schedule your follow up appointment.

## 2013-11-09 ENCOUNTER — Ambulatory Visit (HOSPITAL_COMMUNITY): Payer: Medicare Other | Attending: Family Medicine | Admitting: Radiology

## 2013-11-09 VITALS — BP 123/70 | Wt 136.0 lb

## 2013-11-09 DIAGNOSIS — R079 Chest pain, unspecified: Secondary | ICD-10-CM | POA: Diagnosis present

## 2013-11-09 DIAGNOSIS — R0789 Other chest pain: Secondary | ICD-10-CM

## 2013-11-09 DIAGNOSIS — R9431 Abnormal electrocardiogram [ECG] [EKG]: Secondary | ICD-10-CM | POA: Insufficient documentation

## 2013-11-09 DIAGNOSIS — R6884 Jaw pain: Secondary | ICD-10-CM

## 2013-11-09 MED ORDER — TECHNETIUM TC 99M SESTAMIBI GENERIC - CARDIOLITE
33.0000 | Freq: Once | INTRAVENOUS | Status: AC | PRN
  Administered 2013-11-09: 33 via INTRAVENOUS

## 2013-11-09 MED ORDER — TECHNETIUM TC 99M SESTAMIBI GENERIC - CARDIOLITE
11.0000 | Freq: Once | INTRAVENOUS | Status: AC | PRN
  Administered 2013-11-09: 11 via INTRAVENOUS

## 2013-11-09 NOTE — Progress Notes (Signed)
[facility] NUCLEAR MED [address] [place], [place] [address] [phone]    Cardiology Nuclear Med Study  Rebecca Rasmussen is a 68 y.o. female     MRN : [id number]     DOB: 1945/03/10  Procedure Date: 11/09/2013  Nuclear Med Background Indication for Stress Test:  Evaluation for Ischemia and Abnormal EKG History:  n/a Cardiac Risk Factors: N/A  Symptoms:  Chest Pain   Nuclear Pre-Procedure Caffeine/Decaff Intake:  None NPO After: 5 pm   Lungs:  clear O2 Sat: 95% on room air. IV 0.9% NS with Angio Cath:  22g  IV Site: R Hand  IV Started by:  Bonnita Levan, RN  Chest Size (in):  36 Cup Size: B  Height:  62.5" Weight:  136 lb (61.689 kg)  BMI:  Body mass index is 24.46 kg/(m^2). Tech Comments:  N/A    Nuclear Med Study 1 or 2 day study: 1 day  Stress Test Type:  Stress  Reading MD: N/A  Order Authorizing Provider: M.Skains MD  Resting Radionuclide: Technetium 98m Sestamibi  Resting Radionuclide Dose: 11.0 mCi   Stress Radionuclide:  Technetium 22m Sestamibi  Stress Radionuclide Dose: 33.0 mCi           Stress Protocol Rest HR: 62 Stress HR: 144  Rest BP: 123/70 Stress BP: 197/83  Exercise Time (min): 5:30 METS: 7.0   Predicted Max HR: 153 bpm % Max HR: 94.12 bpm Rate Pressure Product: 81829   Dose of Adenosine (mg):  n/a Dose of Lexiscan: n/a mg  Dose of Atropine (mg): n/a Dose of Dobutamine: n/a mcg/kg/min (at max HR)  Stress Test Technologist: Milana Na, EMT-P  Nuclear Technologist:  Kerby Nora, CNMT     Rest Procedure:  Myocardial perfusion imaging was performed at rest 45 minutes following the intravenous administration of Technetium 60m Sestamibi. Rest ECG: Normal sinus rhythm. Mild ST scooping.  Stress Procedure:  The patient exercised on the treadmill utilizing the Bruce Protocol for 5:30 minutes. The patient stopped due to sob, fatigue, and denied any chest discomfort.  Technetium 58m Sestamibi was injected at peak exercise  and myocardial perfusion imaging was performed after a brief delay. Stress ECG: No significant change from baseline ECG  QPS Raw Data Images:  Normal; no motion artifact; normal heart/lung ratio. Stress Images:  Normal homogeneous uptake in all areas of the myocardium. Rest Images:  Normal homogeneous uptake in all areas of the myocardium. Subtraction (SDS):  No evidence of ischemia. Transient Ischemic Dilatation (Normal <1.22):  1.29 Lung/Heart Ratio (Normal <0.45):  0.33  Quantitative Gated Spect Images QGS EDV:  57 ml QGS ESV:  20 ml  Impression Exercise Capacity:  The patient had fair exercise tolerance. BP Response:  Normal blood pressure response. Clinical Symptoms:  The patient had 2/10 chest discomfort. There was bilateral jaw pain. ECG Impression:  No significant ST segment change suggestive of ischemia. Comparison with Prior Nuclear Study: No images to compare  Overall Impression:  Normal stress nuclear study. The patient did have symptoms. However there was no significant EKG change. The nuclear images are normal. There is no scar or ischemia. This is a low risk scan.  LV Ejection Fraction: 65%.  LV Wall Motion:  Normal Wall Motion.  Willa Rough, MD

## 2013-11-10 ENCOUNTER — Ambulatory Visit (INDEPENDENT_AMBULATORY_CARE_PROVIDER_SITE_OTHER): Payer: Medicare Other | Admitting: Family Medicine

## 2013-11-10 VITALS — BP 146/70 | HR 90 | Temp 98.6°F | Resp 16 | Ht 62.0 in | Wt 138.0 lb

## 2013-11-10 DIAGNOSIS — Z23 Encounter for immunization: Secondary | ICD-10-CM

## 2013-11-10 DIAGNOSIS — IMO0002 Reserved for concepts with insufficient information to code with codable children: Secondary | ICD-10-CM

## 2013-11-10 DIAGNOSIS — M51379 Other intervertebral disc degeneration, lumbosacral region without mention of lumbar back pain or lower extremity pain: Secondary | ICD-10-CM

## 2013-11-10 DIAGNOSIS — M5416 Radiculopathy, lumbar region: Secondary | ICD-10-CM

## 2013-11-10 DIAGNOSIS — M5137 Other intervertebral disc degeneration, lumbosacral region: Secondary | ICD-10-CM

## 2013-11-10 DIAGNOSIS — M5431 Sciatica, right side: Secondary | ICD-10-CM

## 2013-11-10 DIAGNOSIS — M5136 Other intervertebral disc degeneration, lumbar region: Secondary | ICD-10-CM

## 2013-11-10 DIAGNOSIS — M5430 Sciatica, unspecified side: Secondary | ICD-10-CM

## 2013-11-10 MED ORDER — METHOCARBAMOL 500 MG PO TABS: 500.0000 mg | ORAL_TABLET | Freq: Four times a day (QID) | ORAL | Status: DC | PRN

## 2013-11-10 MED ORDER — CELECOXIB 200 MG PO CAPS: 200.0000 mg | ORAL_CAPSULE | Freq: Two times a day (BID) | ORAL | Status: DC

## 2013-11-10 NOTE — Patient Instructions (Signed)
Sciatica with Rehab The sciatic nerve runs from the back down the leg and is responsible for sensation and control of the muscles in the back (posterior) side of the thigh, lower leg, and foot. Sciatica is a condition that is characterized by inflammation of this nerve.  SYMPTOMS   Signs of nerve damage, including numbness and/or weakness along the posterior side of the lower extremity.  Pain in the back of the thigh that may also travel down the leg.  Pain that worsens when sitting for long periods of time.  Occasionally, pain in the back or buttock. CAUSES  Inflammation of the sciatic nerve is the cause of sciatica. The inflammation is due to something irritating the nerve. Common sources of irritation include:  Sitting for long periods of time.  Direct trauma to the nerve.  Arthritis of the spine.  Herniated or ruptured disk.  Slipping of the vertebrae (spondylolisthesis).  Pressure from soft tissues, such as muscles or ligament-like tissue (fascia). RISK INCREASES WITH:  Sports that place pressure or stress on the spine (football or weightlifting).  Poor strength and flexibility.  Failure to warm up properly before activity.  Family history of low back pain or disk disorders.  Previous back injury or surgery.  Poor body mechanics, especially when lifting, or poor posture. PREVENTION   Warm up and stretch properly before activity.  Maintain physical fitness:  Strength, flexibility, and endurance.  Cardiovascular fitness.  Learn and use proper technique, especially with posture and lifting. When possible, have coach correct improper technique.  Avoid activities that place stress on the spine. PROGNOSIS If treated properly, then sciatica usually resolves within 6 weeks. However, occasionally surgery is necessary.  RELATED COMPLICATIONS   Permanent nerve damage, including pain, numbness, tingle, or weakness.  Chronic back pain.  Risks of surgery: infection,  bleeding, nerve damage, or damage to surrounding tissues. TREATMENT Treatment initially involves resting from any activities that aggravate your symptoms. The use of ice and medication may help reduce pain and inflammation. The use of strengthening and stretching exercises may help reduce pain with activity. These exercises may be performed at home or with referral to a therapist. A therapist may recommend further treatments, such as transcutaneous electronic nerve stimulation (TENS) or ultrasound. Your caregiver may recommend corticosteroid injections to help reduce inflammation of the sciatic nerve. If symptoms persist despite non-surgical (conservative) treatment, then surgery may be recommended. MEDICATION  If pain medication is necessary, then nonsteroidal anti-inflammatory medications, such as aspirin and ibuprofen, or other minor pain relievers, such as acetaminophen, are often recommended.  Do not take pain medication for 7 days before surgery.  Prescription pain relievers may be given if deemed necessary by your caregiver. Use only as directed and only as much as you need.  Ointments applied to the skin may be helpful.  Corticosteroid injections may be given by your caregiver. These injections should be reserved for the most serious cases, because they may only be given a certain number of times. HEAT AND COLD  Cold treatment (icing) relieves pain and reduces inflammation. Cold treatment should be applied for 10 to 15 minutes every 2 to 3 hours for inflammation and pain and immediately after any activity that aggravates your symptoms. Use ice packs or massage the area with a piece of ice (ice massage).  Heat treatment may be used prior to performing the stretching and strengthening activities prescribed by your caregiver, physical therapist, or athletic trainer. Use a heat pack or soak the injury in warm water.   SEEK MEDICAL CARE IF:  Treatment seems to offer no benefit, or the condition  worsens.  Any medications produce adverse side effects. EXERCISES  RANGE OF MOTION (ROM) AND STRETCHING EXERCISES - Sciatica Most people with sciatic will find that their symptoms worsen with either excessive bending forward (flexion) or arching at the low back (extension). The exercises which will help resolve your symptoms will focus on the opposite motion. Your physician, physical therapist or athletic trainer will help you determine which exercises will be most helpful to resolve your low back pain. Do not complete any exercises without first consulting with your clinician. Discontinue any exercises which worsen your symptoms until you speak to your clinician. If you have pain, numbness or tingling which travels down into your buttocks, leg or foot, the goal of the therapy is for these symptoms to move closer to your back and eventually resolve. Occasionally, these leg symptoms will get better, but your low back pain may worsen; this is typically an indication of progress in your rehabilitation. Be certain to be very alert to any changes in your symptoms and the activities in which you participated in the 24 hours prior to the change. Sharing this information with your clinician will allow him/her to most efficiently treat your condition. These exercises may help you when beginning to rehabilitate your injury. Your symptoms may resolve with or without further involvement from your physician, physical therapist or athletic trainer. While completing these exercises, remember:   Restoring tissue flexibility helps normal motion to return to the joints. This allows healthier, less painful movement and activity.  An effective stretch should be held for at least 30 seconds.  A stretch should never be painful. You should only feel a gentle lengthening or release in the stretched tissue. FLEXION RANGE OF MOTION AND STRETCHING EXERCISES: STRETCH - Flexion, Single Knee to Chest   Lie on a firm bed or floor  with both legs extended in front of you.  Keeping one leg in contact with the floor, bring your opposite knee to your chest. Hold your leg in place by either grabbing behind your thigh or at your knee.  Pull until you feel a gentle stretch in your low back. Hold __________ seconds.  Slowly release your grasp and repeat the exercise with the opposite side. Repeat __________ times. Complete this exercise __________ times per day.  STRETCH - Flexion, Double Knee to Chest  Lie on a firm bed or floor with both legs extended in front of you.  Keeping one leg in contact with the floor, bring your opposite knee to your chest.  Tense your stomach muscles to support your back and then lift your other knee to your chest. Hold your legs in place by either grabbing behind your thighs or at your knees.  Pull both knees toward your chest until you feel a gentle stretch in your low back. Hold __________ seconds.  Tense your stomach muscles and slowly return one leg at a time to the floor. Repeat __________ times. Complete this exercise __________ times per day.  STRETCH - Low Trunk Rotation   Lie on a firm bed or floor. Keeping your legs in front of you, bend your knees so they are both pointed toward the ceiling and your feet are flat on the floor.  Extend your arms out to the side. This will stabilize your upper body by keeping your shoulders in contact with the floor.  Gently and slowly drop both knees together to one side until   you feel a gentle stretch in your low back. Hold for __________ seconds.  Tense your stomach muscles to support your low back as you bring your knees back to the starting position. Repeat the exercise to the other side. Repeat __________ times. Complete this exercise __________ times per day  EXTENSION RANGE OF MOTION AND FLEXIBILITY EXERCISES: STRETCH - Extension, Prone on Elbows  Lie on your stomach on the floor, a bed will be too soft. Place your palms about shoulder  width apart and at the height of your head.  Place your elbows under your shoulders. If this is too painful, stack pillows under your chest.  Allow your body to relax so that your hips drop lower and make contact more completely with the floor.  Hold this position for __________ seconds.  Slowly return to lying flat on the floor. Repeat __________ times. Complete this exercise __________ times per day.  RANGE OF MOTION - Extension, Prone Press Ups  Lie on your stomach on the floor, a bed will be too soft. Place your palms about shoulder width apart and at the height of your head.  Keeping your back as relaxed as possible, slowly straighten your elbows while keeping your hips on the floor. You may adjust the placement of your hands to maximize your comfort. As you gain motion, your hands will come more underneath your shoulders.  Hold this position __________ seconds.  Slowly return to lying flat on the floor. Repeat __________ times. Complete this exercise __________ times per day.  STRENGTHENING EXERCISES - Sciatica  These exercises may help you when beginning to rehabilitate your injury. These exercises should be done near your "sweet spot." This is the neutral, low-back arch, somewhere between fully rounded and fully arched, that is your least painful position. When performed in this safe range of motion, these exercises can be used for people who have either a flexion or extension based injury. These exercises may resolve your symptoms with or without further involvement from your physician, physical therapist or athletic trainer. While completing these exercises, remember:   Muscles can gain both the endurance and the strength needed for everyday activities through controlled exercises.  Complete these exercises as instructed by your physician, physical therapist or athletic trainer. Progress with the resistance and repetition exercises only as your caregiver advises.  You may  experience muscle soreness or fatigue, but the pain or discomfort you are trying to eliminate should never worsen during these exercises. If this pain does worsen, stop and make certain you are following the directions exactly. If the pain is still present after adjustments, discontinue the exercise until you can discuss the trouble with your clinician. STRENGTHENING - Deep Abdominals, Pelvic Tilt   Lie on a firm bed or floor. Keeping your legs in front of you, bend your knees so they are both pointed toward the ceiling and your feet are flat on the floor.  Tense your lower abdominal muscles to press your low back into the floor. This motion will rotate your pelvis so that your tail bone is scooping upwards rather than pointing at your feet or into the floor.  With a gentle tension and even breathing, hold this position for __________ seconds. Repeat __________ times. Complete this exercise __________ times per day.  STRENGTHENING - Abdominals, Crunches   Lie on a firm bed or floor. Keeping your legs in front of you, bend your knees so they are both pointed toward the ceiling and your feet are flat on the   floor. Cross your arms over your chest.  Slightly tip your chin down without bending your neck.  Tense your abdominals and slowly lift your trunk high enough to just clear your shoulder blades. Lifting higher can put excessive stress on the low back and does not further strengthen your abdominal muscles.  Control your return to the starting position. Repeat __________ times. Complete this exercise __________ times per day.  STRENGTHENING - Quadruped, Opposite UE/LE Lift  Assume a hands and knees position on a firm surface. Keep your hands under your shoulders and your knees under your hips. You may place padding under your knees for comfort.  Find your neutral spine and gently tense your abdominal muscles so that you can maintain this position. Your shoulders and hips should form a rectangle  that is parallel with the floor and is not twisted.  Keeping your trunk steady, lift your right hand no higher than your shoulder and then your left leg no higher than your hip. Make sure you are not holding your breath. Hold this position __________ seconds.  Continuing to keep your abdominal muscles tense and your back steady, slowly return to your starting position. Repeat with the opposite arm and leg. Repeat __________ times. Complete this exercise __________ times per day.  STRENGTHENING - Abdominals and Quadriceps, Straight Leg Raise   Lie on a firm bed or floor with both legs extended in front of you.  Keeping one leg in contact with the floor, bend the other knee so that your foot can rest flat on the floor.  Find your neutral spine, and tense your abdominal muscles to maintain your spinal position throughout the exercise.  Slowly lift your straight leg off the floor about 6 inches for a count of 15, making sure to not hold your breath.  Still keeping your neutral spine, slowly lower your leg all the way to the floor. Repeat this exercise with each leg __________ times. Complete this exercise __________ times per day. POSTURE AND BODY MECHANICS CONSIDERATIONS - Sciatica Keeping correct posture when sitting, standing or completing your activities will reduce the stress put on different body tissues, allowing injured tissues a chance to heal and limiting painful experiences. The following are general guidelines for improved posture. Your physician or physical therapist will provide you with any instructions specific to your needs. While reading these guidelines, remember:  The exercises prescribed by your provider will help you have the flexibility and strength to maintain correct postures.  The correct posture provides the optimal environment for your joints to work. All of your joints have less wear and tear when properly supported by a spine with good posture. This means you will  experience a healthier, less painful body.  Correct posture must be practiced with all of your activities, especially prolonged sitting and standing. Correct posture is as important when doing repetitive low-stress activities (typing) as it is when doing a single heavy-load activity (lifting). RESTING POSITIONS Consider which positions are most painful for you when choosing a resting position. If you have pain with flexion-based activities (sitting, bending, stooping, squatting), choose a position that allows you to rest in a less flexed posture. You would want to avoid curling into a fetal position on your side. If your pain worsens with extension-based activities (prolonged standing, working overhead), avoid resting in an extended position such as sleeping on your stomach. Most people will find more comfort when they rest with their spine in a more neutral position, neither too rounded nor too   arched. Lying on a non-sagging bed on your side with a pillow between your knees, or on your back with a pillow under your knees will often provide some relief. Keep in mind, being in any one position for a prolonged period of time, no matter how correct your posture, can still lead to stiffness. PROPER SITTING POSTURE In order to minimize stress and discomfort on your spine, you must sit with correct posture Sitting with good posture should be effortless for a healthy body. Returning to good posture is a gradual process. Many people can work toward this most comfortably by using various supports until they have the flexibility and strength to maintain this posture on their own. When sitting with proper posture, your ears will fall over your shoulders and your shoulders will fall over your hips. You should use the back of the chair to support your upper back. Your low back will be in a neutral position, just slightly arched. You may place a small pillow or folded towel at the base of your low back for support.  When  working at a desk, create an environment that supports good, upright posture. Without extra support, muscles fatigue and lead to excessive strain on joints and other tissues. Keep these recommendations in mind: CHAIR:   A chair should be able to slide under your desk when your back makes contact with the back of the chair. This allows you to work closely.  The chair's height should allow your eyes to be level with the upper part of your monitor and your hands to be slightly lower than your elbows. BODY POSITION  Your feet should make contact with the floor. If this is not possible, use a foot rest.  Keep your ears over your shoulders. This will reduce stress on your neck and low back. INCORRECT SITTING POSTURES   If you are feeling tired and unable to assume a healthy sitting posture, do not slouch or slump. This puts excessive strain on your back tissues, causing more damage and pain. Healthier options include:  Using more support, like a lumbar pillow.  Switching tasks to something that requires you to be upright or walking.  Talking a brief walk.  Lying down to rest in a neutral-spine position. PROLONGED STANDING WHILE SLIGHTLY LEANING FORWARD  When completing a task that requires you to lean forward while standing in one place for a long time, place either foot up on a stationary 2-4 inch high object to help maintain the best posture. When both feet are on the ground, the low back tends to lose its slight inward curve. If this curve flattens (or becomes too large), then the back and your other joints will experience too much stress, fatigue more quickly and can cause pain.  CORRECT STANDING POSTURES Proper standing posture should be assumed with all daily activities, even if they only take a few moments, like when brushing your teeth. As in sitting, your ears should fall over your shoulders and your shoulders should fall over your hips. You should keep a slight tension in your abdominal  muscles to brace your spine. Your tailbone should point down to the ground, not behind your body, resulting in an over-extended swayback posture.  INCORRECT STANDING POSTURES  Common incorrect standing postures include a forward head, locked knees and/or an excessive swayback. WALKING Walk with an upright posture. Your ears, shoulders and hips should all line-up. PROLONGED ACTIVITY IN A FLEXED POSITION When completing a task that requires you to bend forward   at your waist or lean over a low surface, try to find a way to stabilize 3 of 4 of your limbs. You can place a hand or elbow on your thigh or rest a knee on the surface you are reaching across. This will provide you more stability so that your muscles do not fatigue as quickly. By keeping your knees relaxed, or slightly bent, you will also reduce stress across your low back. CORRECT LIFTING TECHNIQUES DO :   Assume a wide stance. This will provide you more stability and the opportunity to get as close as possible to the object which you are lifting.  Tense your abdominals to brace your spine; then bend at the knees and hips. Keeping your back locked in a neutral-spine position, lift using your leg muscles. Lift with your legs, keeping your back straight.  Test the weight of unknown objects before attempting to lift them.  Try to keep your elbows locked down at your sides in order get the best strength from your shoulders when carrying an object.  Always ask for help when lifting heavy or awkward objects. INCORRECT LIFTING TECHNIQUES DO NOT:   Lock your knees when lifting, even if it is a small object.  Bend and twist. Pivot at your feet or move your feet when needing to change directions.  Assume that you cannot safely pick up a paperclip without proper posture. Document Released: 02/05/2005 Document Revised: 06/22/2013 Document Reviewed: 05/20/2008 [employer] Patient Information 2015 [employer]. This information is not intended to  replace advice given to you by your health care provider. Make sure you discuss any questions you have with your health care provider.  

## 2013-11-10 NOTE — Progress Notes (Signed)
Subjective:    Patient ID: Rebecca Rasmussen, female    DOB: 1946-01-23, 68 y.o.   MRN: [id number] Chief Complaint  Patient presents with  . Spasms    In back, affecting right leg, X 4 days  . Leg Pain    Right, X 4 days    HPI  About 3-4d prev she had sudden episode while walking of her knee locking up and pain radiating down to her foot and up her thigh, then started back spasms.  She has had continuing knee pain since and has noticed knots in the back of her right calf.  She did not appreciate any swelling. Tried treating with ice and heat though ice made her toes twitch.  No pain is intermittent now after 2d of continuous but still there. No edema,  Having to walk with a cane as feels like knee is going to give out. No known h/o prior knee injuries.  Took herself off of crestor as she was worried about side effects. Her rheumatologist is Dr. Elenor Quinones who sees her for her thumbs and Dr. Shon Baton sees her for her back [facility]) DDD. Is on celebrex which she has been using daily from Dr. Shon Baton and takes the once weekly vitamin D.  Does take vitamins but no other otc meds. No h/o blood clots. Recently had a stress test - has not heard results from cardiology but no CP/palps in past several days.  Past Medical History  Diagnosis Date  . Osteoporosis     osteopenia  . Menopause 2001    Took ERT 08/1999 - 2/ 2007  . Atrophic vaginitis   . Anxiety   . Osteopenia 07/2008  . DDD (degenerative disc disease), lumbar 05/2012    had a fall with sslipped disck 06/14/12  . Premature ovarian failure age 5   Current Outpatient Prescriptions on File Prior to Visit  Medication Sig Dispense Refill  . aspirin 81 MG tablet Take 81 mg by mouth 2 (two) times daily.      . calcium-vitamin D (CALCIUM + D) 250-125 MG-UNIT per tablet Take 1 tablet by mouth daily.      . celecoxib (CELEBREX) 200 MG capsule Take 200 mg by mouth daily. PRN      . Coenzyme Q10 (COQ10) 100 MG CAPS Take by mouth 3 (three)  times a week.       . cycloSPORINE (RESTASIS) 0.05 % ophthalmic emulsion Place 1 drop into both eyes 2 (two) times daily.      . Glucosamine-Chondroit-Vit C-Mn (GLUCOSAMINE CHONDR 1500 COMPLX) CAPS Take by mouth daily.      . Loratadine (CLARITIN) 10 MG CAPS Take by mouth as needed.      . Magnesium 400 MG CAPS Take by mouth daily.      . Multiple Vitamins-Minerals (CENTRUM SPECIALIST HEART PO) Take by mouth 2 (two) times daily.      . Omega 3 1000 MG CAPS Take 1 capsule by mouth daily.      . rosuvastatin (CRESTOR) 10 MG tablet Take 10 mg by mouth 3 (three) times a week.      . vitamin B-12 (CYANOCOBALAMIN) 1000 MCG tablet Take 1,000 mcg by mouth daily.      . vitamin C (ASCORBIC ACID) 500 MG tablet Take 500 mg by mouth daily.      . Vitamin D, Ergocalciferol, (DRISDOL) 50000 UNITS CAPS capsule Take 1 capsule (50,000 Units total) by mouth every 7 (seven) days.  30 capsule  3  .  vitamin E 100 UNIT capsule Take 400 Units by mouth daily.      . mometasone (NASONEX) 50 MCG/ACT nasal spray Place 2 sprays into the nose daily.       Current Facility-Administered Medications on File Prior to Visit  Medication Dose Route Frequency Provider Last Rate Last Dose  . methylPREDNISolone acetate (DEPO-MEDROL) injection 80 mg  80 mg Intramuscular Once Thao P Le, DO       Allergies  Allergen Reactions  . Zetia [Ezetimibe] Rash  . Crestor [Rosuvastatin Calcium] Other (See Comments)    Severe muscle pain at higher dose  . Darvocet [Propoxyphene N-Acetaminophen] Other (See Comments)    Doesn't remember reaction  . Lipitor [Atorvastatin Calcium] Other (See Comments)    Severe muscle pain  . Prednisone Other (See Comments)    Weight gain  . Promethazine Other (See Comments)    Pt doesn't remember reaction  . Vicodin [Hydrocodone-Acetaminophen] Nausea Only  . Zocor [Simvastatin - High Dose] Other (See Comments)    Severe muscle pain  . Amoxicillin-Pot Clavulanate Rash  . Naprosyn [Naproxen] Rash  .  Naproxen Sodium Rash      Review of Systems  Constitutional: Negative for fever and chills.  Cardiovascular: Negative for chest pain, palpitations and leg swelling.  Gastrointestinal: Negative for nausea, vomiting, abdominal pain, diarrhea and constipation.  Genitourinary: Negative for urgency, frequency, decreased urine volume and difficulty urinating.  Musculoskeletal: Positive for arthralgias, back pain, gait problem and myalgias. Negative for joint swelling.  Neurological: Negative for weakness and numbness.  Hematological: Negative for adenopathy. Does not bruise/bleed easily.       Objective:  BP 146/70  Pulse 90  Temp(Src) 98.6 F (37 C) (Oral)  Resp 16  Ht  (1.575 m)  Wt 138 lb (62.596 kg)  BMI 25.23 kg/m2  SpO2 94%  LMP 08/22/1999  Physical Exam  Constitutional: She is oriented to person, place, and time. She appears well-developed and well-nourished.  HENT:  Head: Normocephalic.  Eyes: Conjunctivae are normal. No scleral icterus.  Neck: Normal range of motion. Neck supple.  Cardiovascular: Normal rate, regular rhythm and normal heart sounds.   Pulmonary/Chest: Effort normal and breath sounds normal. No respiratory distress.  Musculoskeletal: Normal range of motion. She exhibits no edema.       Right hip: Normal.       Left hip: Normal.       Lumbar back: She exhibits tenderness, pain and spasm. She exhibits normal range of motion, no bony tenderness and no deformity.  Neurological: She is alert and oriented to person, place, and time. She has normal strength and normal reflexes. No sensory deficit. She exhibits normal muscle tone. Coordination and gait normal.  Reflex Scores:      Patellar reflexes are 2+ on the right side and 2+ on the left side.      Achilles reflexes are 2+ on the right side and 2+ on the left side. Skin: Skin is warm and dry. No erythema.  Psychiatric: She has a normal mood and affect. Her behavior is normal.          Assessment &  Plan:   DDD (degenerative disc disease), lumbar - Plan: Ambulatory referral to Orthopedic Surgery  Sciatica associated with disorder of lumbosacral spine, right - Plan: Ambulatory referral to Orthopedic Surgery  Lumbar radiculopathy, acute - Plan: Ambulatory referral to Orthopedic Surgery  Need for prophylactic vaccination and inoculation against influenza - Plan: Flu Vaccine QUAD 36+ mos IM  Meds ordered this  encounter  Medications  . Saline (SIMPLY SALINE) 0.9 % AERS    Sig: Place into the nose.  . celecoxib (CELEBREX) 200 MG capsule    Sig: Take 1 capsule (200 mg total) by mouth 2 (two) times daily. PRN    Dispense:  60 capsule    Refill:  0  . methocarbamol (ROBAXIN) 500 MG tablet    Sig: Take 1 tablet (500 mg total) by mouth every 6 (six) hours as needed for muscle spasms.    Dispense:  60 tablet    Refill:  0   [name], MD MPH

## 2013-11-16 ENCOUNTER — Telehealth: Payer: Self-pay | Admitting: Cardiology

## 2013-11-16 NOTE — Telephone Encounter (Signed)
Reviewed results with pt who states understanding. 

## 2013-11-16 NOTE — Telephone Encounter (Signed)
New problem ° ° ° °Pt returning a call concerning lab results. Please call pt. °

## 2013-12-08 ENCOUNTER — Other Ambulatory Visit: Payer: Self-pay

## 2013-12-08 DIAGNOSIS — Z1231 Encounter for screening mammogram for malignant neoplasm of breast: Secondary | ICD-10-CM

## 2013-12-11 ENCOUNTER — Telehealth: Payer: Self-pay | Admitting: Cardiology

## 2013-12-11 NOTE — Telephone Encounter (Signed)
New message     FYI Pt stopped crestor 10mg  because of muscle pain----she has been off of rx for 1wk and the muscle pain is much better

## 2013-12-11 NOTE — Telephone Encounter (Signed)
Left message for pt that I received her message and will make sure Dr Anne FuSkains is aware.  Requested she call back if further questions or concerns.

## 2013-12-12 NOTE — Telephone Encounter (Signed)
Thx for update. Could consider Livalo at next visit.

## 2013-12-25 ENCOUNTER — Ambulatory Visit
Admission: RE | Admit: 2013-12-25 | Discharge: 2013-12-25 | Disposition: A | Discharge status: Home / Self Care | Payer: Medicare Other | Source: Ambulatory Visit

## 2013-12-25 DIAGNOSIS — Z1231 Encounter for screening mammogram for malignant neoplasm of breast: Secondary | ICD-10-CM

## 2014-04-27 ENCOUNTER — Ambulatory Visit (INDEPENDENT_AMBULATORY_CARE_PROVIDER_SITE_OTHER): Payer: Medicare Other

## 2014-04-27 ENCOUNTER — Ambulatory Visit (INDEPENDENT_AMBULATORY_CARE_PROVIDER_SITE_OTHER): Payer: Medicare Other | Admitting: Family Medicine

## 2014-04-27 VITALS — BP 146/60 | HR 103 | Temp 99.8°F | Resp 16 | Ht 62.5 in | Wt 135.0 lb

## 2014-04-27 DIAGNOSIS — R079 Chest pain, unspecified: Secondary | ICD-10-CM

## 2014-04-27 DIAGNOSIS — R059 Cough, unspecified: Secondary | ICD-10-CM

## 2014-04-27 DIAGNOSIS — R05 Cough: Secondary | ICD-10-CM

## 2014-04-27 DIAGNOSIS — R0602 Shortness of breath: Secondary | ICD-10-CM

## 2014-04-27 DIAGNOSIS — B349 Viral infection, unspecified: Secondary | ICD-10-CM

## 2014-04-27 LAB — POCT CBC
GRANULOCYTE PERCENT: 73.3 % (ref 37–80)
HCT, POC: 42.3 % (ref 37.7–47.9)
Hemoglobin: 14.1 g/dL (ref 12.2–16.2)
LYMPH, POC: 1.1 (ref 0.6–3.4)
MCH: 30.3 pg (ref 27–31.2)
MCHC: 33.3 g/dL (ref 31.8–35.4)
MCV: 90.9 fL (ref 80–97)
MID (CBC): 0.5 (ref 0–0.9)
MPV: 6.3 fL (ref 0–99.8)
POC Granulocyte: 4.4 (ref 2–6.9)
POC LYMPH %: 18.7 % (ref 10–50)
POC MID %: 8 %M (ref 0–12)
Platelet Count, POC: 219 10*3/uL (ref 142–424)
RBC: 4.66 M/uL (ref 4.04–5.48)
RDW, POC: 13.7 %
WBC: 6 10*3/uL (ref 4.6–10.2)

## 2014-04-27 LAB — POCT INFLUENZA A/B
INFLUENZA B, POC: NEGATIVE
Influenza A, POC: NEGATIVE

## 2014-04-27 MED ORDER — HYDROCODONE-HOMATROPINE 5-1.5 MG/5ML PO SYRP: ORAL_SOLUTION | ORAL | Status: DC

## 2014-04-27 MED ORDER — BENZONATATE 100 MG PO CAPS: 100.0000 mg | ORAL_CAPSULE | Freq: Three times a day (TID) | ORAL | Status: DC | PRN

## 2014-04-27 NOTE — Progress Notes (Signed)
Subjective:  This chart was scribed for Shade FloodJeffrey R Tayvia Faughnan, MD by Charline BillsEssence Howell, ED Scribe. The patient was seen in room 7. Patient's care was started at 4:04 PM.   Patient ID: Rebecca Rasmussen, female    DOB: 07/12/1945, 69 y.o.   MRN: [id number]  Chief Complaint  Patient presents with  . Chest Pain    since last night   . Cough  . head congestion  . Shortness of Breath   HPI  HPI Comments: Rebecca Rasmussen is a 69 y.o. female, with a h/o HTN and hyperlipidemia, who presents to the [facility] complaining of initial congestion this morning followed by chest tightness and pressure. She describes this as a burning sensation and pressure or heaviest in her chest, persistent since this morning with some radiation to her neck but not to her arms or back. No cardiac history. She had a reassuring nuclear stress test in September 2015 that was performed for intermittent chest pain with radiation to her jaw at that time. Low risk scan with EF of 65% with normal wall motion on 11/09/13. No recent prolonged car travel. She has had a flushed feeling a few times today and some SOB with current chest sensation at times.   See other note by Gurney MaxinMike Mani, PA-C regarding history.   PCP: Hollice EspyGATES,DONNA RUTH, MD  Patient Active Problem List   Diagnosis Date Noted  . Other chest pain 10/28/2013  . Pure hypercholesterolemia 10/28/2013  . Statin intolerance 10/28/2013  . Osteoporosis   . Menopause   . Atrophic vaginitis   . Anxiety   . Premature ovarian failure   . DDD (degenerative disc disease), lumbar 05/20/2012  . Osteopenia 07/20/2008   Past Medical History  Diagnosis Date  . Osteoporosis     osteopenia  . Menopause 2001    Took ERT 08/1999 - 2/ 2007  . Atrophic vaginitis   . Anxiety   . Osteopenia 07/2008  . DDD (degenerative disc disease), lumbar 05/2012    had a fall with sslipped disck 06/14/12  . Premature ovarian failure age 69   Past Surgical History  Procedure  Laterality Date  . Abdominal hysterectomy  08/1999    secondary to fibroids  . Toe surgery Left about 2008    cyst removed under left great toe  . Eye surgery    . Appendectomy     Allergies  Allergen Reactions  . Zetia [Ezetimibe] Rash  . Crestor [Rosuvastatin Calcium] Other (See Comments)    Severe muscle pain at higher dose  . Darvocet [Propoxyphene N-Acetaminophen] Other (See Comments)    Doesn't remember reaction  . Lipitor [Atorvastatin Calcium] Other (See Comments)    Severe muscle pain  . Prednisone Other (See Comments)    Weight gain  . Promethazine Other (See Comments)    Pt doesn't remember reaction  . Vicodin [Hydrocodone-Acetaminophen] Nausea Only  . Zocor [Simvastatin - High Dose] Other (See Comments)    Severe muscle pain  . Amoxicillin-Pot Clavulanate Rash  . Naprosyn [Naproxen] Rash  . Naproxen Sodium Rash   Prior to Admission medications   Medication Sig Start Date End Date Taking? Authorizing Provider  aspirin 81 MG tablet Take 81 mg by mouth 2 (two) times daily.   Yes Historical Provider, MD  calcium-vitamin D (CALCIUM + D) 250-125 MG-UNIT per tablet Take 1 tablet by mouth daily.   Yes Historical Provider, MD  celecoxib (CELEBREX) 200 MG capsule Take 1 capsule (200 mg total) by  mouth 2 (two) times daily. PRN 11/10/13  Yes Sherren Mocha, MD  cycloSPORINE (RESTASIS) 0.05 % ophthalmic emulsion Place 1 drop into both eyes 2 (two) times daily.   Yes Historical Provider, MD  Glucosamine-Chondroit-Vit C-Mn (GLUCOSAMINE CHONDR 1500 COMPLX) CAPS Take by mouth daily.   Yes Historical Provider, MD  hydrochlorothiazide (HYDRODIURIL) 25 MG tablet Take 25 mg by mouth daily.   Yes Historical Provider, MD  Loratadine (CLARITIN) 10 MG CAPS Take by mouth as needed.   Yes Historical Provider, MD  Magnesium 400 MG CAPS Take by mouth daily.   Yes Historical Provider, MD  Multiple Vitamins-Minerals (CENTRUM SPECIALIST HEART PO) Take by mouth 2 (two) times daily.   Yes Historical  Provider, MD  Omega 3 1000 MG CAPS Take 1 capsule by mouth daily.   Yes Historical Provider, MD  Saline (SIMPLY SALINE) 0.9 % AERS Place into the nose.   Yes Historical Provider, MD  vitamin B-12 (CYANOCOBALAMIN) 1000 MCG tablet Take 1,000 mcg by mouth daily.   Yes Historical Provider, MD  vitamin C (ASCORBIC ACID) 500 MG tablet Take 500 mg by mouth daily.   Yes Historical Provider, MD  Vitamin D, Ergocalciferol, (DRISDOL) 50000 UNITS CAPS capsule Take 1 capsule (50,000 Units total) by mouth every 7 (seven) days. 06/29/13  Yes Roanna Banning, FNP  vitamin E 100 UNIT capsule Take 400 Units by mouth daily.   Yes Historical Provider, MD  Coenzyme Q10 (COQ10) 100 MG CAPS Take by mouth 3 (three) times a week.     Historical Provider, MD  methocarbamol (ROBAXIN) 500 MG tablet Take 1 tablet (500 mg total) by mouth every 6 (six) hours as needed for muscle spasms. Patient not taking: Reported on 04/27/2014 11/10/13   Sherren Mocha, MD  mometasone (NASONEX) 50 MCG/ACT nasal spray Place 2 sprays into the nose daily.    Historical Provider, MD   History   Social History  . Marital Status: Married    Spouse Name: N/A  . Number of Children: N/A  . Years of Education: N/A   Occupational History  . Not on file.   Social History Main Topics  . Smoking status: Never Smoker   . Smokeless tobacco: Never Used  . Alcohol Use: No  . Drug Use: No  . Sexual Activity: No   Other Topics Concern  . Not on file   Social History Narrative   Review of Systems  HENT: Positive for congestion.   Respiratory: Positive for chest tightness and shortness of breath.   Cardiovascular: Positive for chest pain.      Objective:   Physical Exam  Constitutional: She is oriented to person, place, and time. She appears well-developed and well-nourished. No distress.  Pt appears uncomfortable but not diaphoretic.  HENT:  Head: Normocephalic and atraumatic.  Right Ear: Hearing, tympanic membrane, external ear and ear canal  normal.  Left Ear: Hearing, tympanic membrane, external ear and ear canal normal.  Nose: Nose normal.  Mouth/Throat: Oropharynx is clear and moist. No oropharyngeal exudate.  Eyes: Conjunctivae and EOM are normal. Pupils are equal, round, and reactive to light.  Cardiovascular: Normal rate, regular rhythm, normal heart sounds and intact distal pulses.   No murmur heard. Pulmonary/Chest: Effort normal and breath sounds normal. No respiratory distress. She has no wheezes. She has no rhonchi.  Unable to reproduce pain on palpation.   Musculoskeletal: She exhibits no edema.  Calves nontender. No calf swelling or pedal edema.   Neurological: She is alert and  oriented to person, place, and time.  Skin: Skin is warm and dry. No rash noted. She is not diaphoretic.  Psychiatric: She has a normal mood and affect. Her behavior is normal.  Vitals reviewed.  Filed Vitals:   04/27/14 1513  BP: 146/60  Pulse: 103  Temp: 99.8 F (37.7 C)  TempSrc: Oral  Resp: 16  Height: 5' 2.5" (1.588 m)  Weight: 135 lb (61.236 kg)  SpO2: 96%  EKG: When compared to September 2015, Q wave and lead III with nonspecific ST waves in lead II, lead III. In lateral leads without apparent acute findings otherwise.   [facility] reading (PRIMARY) by  Dr. Neva Seat: CXR- Increased markings R greater than L. Perihilar in R lower lobe.   Xray reading: IMPRESSION: Mild bibasilar atelectasis without focal confluent infiltrate.  Results for orders placed or performed in visit on 04/27/14  POCT CBC  Result Value Ref Range   WBC 6.0 4.6 - 10.2 K/uL   Lymph, poc 1.1 0.6 - 3.4   POC LYMPH PERCENT 18.7 10 - 50 %L   MID (cbc) 0.5 0 - 0.9   POC MID % 8.0 0 - 12 %M   POC Granulocyte 4.4 2 - 6.9   Granulocyte percent 73.3 37 - 80 %G   RBC 4.66 4.04 - 5.48 M/uL   Hemoglobin 14.1 12.2 - 16.2 g/dL   HCT, POC 16.1 09.6 - 47.9 %   MCV 90.9 80 - 97 fL   MCH, POC 30.3 27 - 31.2 pg   MCHC 33.3 31.8 - 35.4 g/dL   RDW, POC 04.5 %    Platelet Count, POC 219 142 - 424 K/uL   MPV 6.3 0 - 99.8 fL  POCT Influenza A/B  Result Value Ref Range   Influenza A, POC Negative    Influenza B, POC Negative    5:23 PM burning chest sensation has resolved with cough - coughing up some clear phlegm. Rhinorrhea/congestion present.      Assessment & Plan:    I personally performed the services described in this documentation, which was scribed in my presence. The recorded information has been reviewed and considered, and addended by me as needed.

## 2014-04-27 NOTE — Patient Instructions (Signed)
I suspect you have an early viral infection, and possibly influenza, although your flu test was negative/normal. Saline nasal spray atleast 4 times per day for nasal congestion,  over the counter mucinex DM or prescription tessalon if needed for cough. Drink plenty of fluids.  If cough is keeping you up at night you can take the hydrocodone cough syrup.  If you do have heartburn symptoms - you can try over the counter Zantac. Follow up with me tomorrow night or Thursday morning. Return to the clinic or go to the nearest emergency room if any of your symptoms worsen or new symptoms occur.  If chest pain returns or worsens - call 911 or go to the emergency room.  Cough, Adult  A cough is a reflex that helps clear your throat and airways. It can help heal the body or may be a reaction to an irritated airway. A cough may only last 2 or 3 weeks (acute) or may last more than 8 weeks (chronic).  CAUSES Acute cough:  Viral or bacterial infections. Chronic cough:  Infections.  Allergies.  Asthma.  Post-nasal drip.  Smoking.  Heartburn or acid reflux.  Some medicines.  Chronic lung problems (COPD).  Cancer. SYMPTOMS   Cough.  Fever.  Chest pain.  Increased breathing rate.  High-pitched whistling sound when breathing (wheezing).  Colored mucus that you cough up (sputum). TREATMENT   A bacterial cough may be treated with antibiotic medicine.  A viral cough must run its course and will not respond to antibiotics.  Your caregiver may recommend other treatments if you have a chronic cough. HOME CARE INSTRUCTIONS   Only take over-the-counter or prescription medicines for pain, discomfort, or fever as directed by your caregiver. Use cough suppressants only as directed by your caregiver.  Use a cold steam vaporizer or humidifier in your bedroom or home to help loosen secretions.  Sleep in a semi-upright position if your cough is worse at night.  Rest as needed.  Stop smoking if  you smoke. SEEK IMMEDIATE MEDICAL CARE IF:   You have pus in your sputum.  Your cough starts to worsen.  You cannot control your cough with suppressants and are losing sleep.  You begin coughing up blood.  You have difficulty breathing.  You develop pain which is getting worse or is uncontrolled with medicine.  You have a fever. MAKE SURE YOU:   Understand these instructions.  Will watch your condition.  Will get help right away if you are not doing well or get worse. Document Released: 08/04/2010 Document Revised: 04/30/2011 Document Reviewed: 08/04/2010 [facility] Patient Information 2015 [place] [place]. This information is not intended to replace advice given to you by your health care provider. Make sure you discuss any questions you have with your health care provider.

## 2014-07-01 ENCOUNTER — Encounter: Payer: Self-pay | Admitting: Cardiology

## 2014-07-07 ENCOUNTER — Telehealth: Payer: Self-pay | Admitting: Cardiology

## 2014-07-07 NOTE — Telephone Encounter (Signed)
Follow Up   Pt returning call regarding test results. Please call back.

## 2014-07-07 NOTE — Telephone Encounter (Signed)
Reviewed results of lipids with pt.  She is aware she has been referred to the lipid clinic for further management.

## 2014-07-12 ENCOUNTER — Encounter: Payer: Self-pay | Admitting: Nurse Practitioner

## 2014-07-12 ENCOUNTER — Ambulatory Visit (INDEPENDENT_AMBULATORY_CARE_PROVIDER_SITE_OTHER): Payer: Medicare Other | Admitting: Nurse Practitioner

## 2014-07-12 VITALS — BP 110/82 | HR 96 | Ht 62.5 in | Wt 131.6 lb

## 2014-07-12 DIAGNOSIS — Z01419 Encounter for gynecological examination (general) (routine) without abnormal findings: Secondary | ICD-10-CM

## 2014-07-12 DIAGNOSIS — E559 Vitamin D deficiency, unspecified: Secondary | ICD-10-CM | POA: Diagnosis not present

## 2014-07-12 MED ORDER — VITAMIN D (ERGOCALCIFEROL) 1.25 MG (50000 UNIT) PO CAPS: 50000.0000 [IU] | ORAL_CAPSULE | ORAL | Status: DC

## 2014-07-12 NOTE — Patient Instructions (Signed)

## 2014-07-12 NOTE — Progress Notes (Signed)
69 y.o. G0P0 Married  Caucasian Fe here for annual exam.  New diagnoses of HTN.  Will be going to 50 th HS reunion in August - very excited to be going.  Patient's last menstrual period was 08/22/1999.          Sexually active: No.  The current method of family planning is status post hysterectomy.    Exercising: Yes.    walking, qd Smoker:  no  Health Maintenance: Pap:  05/07/2001 wnl MMG:  12/25/13 bi-rads 1: negative Colonoscopy:  06/03/14 polyps benign,  F/u in 2019 BMD:   06/29/13 T score: spine -0.4, left femur neck -0.8 TDaP:  Not sure Shingles: 07/01/2014 Labs: Shaune Pollackonna Gates, MD    reports that she has never smoked. She has never used smokeless tobacco. She reports that she does not drink alcohol or use illicit drugs.  Past Medical History  Diagnosis Date  . Osteoporosis     osteopenia  . Menopause 2001    Took ERT 08/1999 - 2/ 2007  . Atrophic vaginitis   . Anxiety   . Osteopenia 07/2008  . DDD (degenerative disc disease), lumbar 05/2012    had a fall with sslipped disck 06/14/12  . Premature ovarian failure age 69    Past Surgical History  Procedure Laterality Date  . Abdominal hysterectomy  08/1999    secondary to fibroids  . Toe surgery Left about 2008    cyst removed under left great toe  . Eye surgery    . Appendectomy      Current Outpatient Prescriptions  Medication Sig Dispense Refill  . aspirin 81 MG tablet Take 81 mg by mouth 2 (two) times daily.    . calcium-vitamin D (CALCIUM + D) 250-125 MG-UNIT per tablet Take 1 tablet by mouth daily.    . celecoxib (CELEBREX) 200 MG capsule Take 1 capsule (200 mg total) by mouth 2 (two) times daily. PRN 60 capsule 0  . cycloSPORINE (RESTASIS) 0.05 % ophthalmic emulsion Place 1 drop into both eyes 2 (two) times daily.    . Glucosamine-Chondroit-Vit C-Mn (GLUCOSAMINE CHONDR 1500 COMPLX) CAPS Take by mouth daily.    . hydrochlorothiazide (HYDRODIURIL) 25 MG tablet Take 25 mg by mouth daily.    . Loratadine (CLARITIN) 10 MG  CAPS Take by mouth as needed.    . Magnesium 400 MG CAPS Take by mouth daily.    . Multiple Vitamins-Minerals (CENTRUM SPECIALIST HEART PO) Take by mouth 2 (two) times daily.    . Omega 3 1000 MG CAPS Take 1 capsule by mouth daily.    . Saline (SIMPLY SALINE) 0.9 % AERS Place into the nose.    . vitamin B-12 (CYANOCOBALAMIN) 1000 MCG tablet Take 1,000 mcg by mouth daily.    . vitamin C (ASCORBIC ACID) 500 MG tablet Take 500 mg by mouth daily.    . Vitamin D, Ergocalciferol, (DRISDOL) 50000 UNITS CAPS capsule Take 1 capsule (50,000 Units total) by mouth every 7 (seven) days. 30 capsule 3  . vitamin E 100 UNIT capsule Take 400 Units by mouth daily.     Current Facility-Administered Medications  Medication Dose Route Frequency Provider Last Rate Last Dose  . methylPREDNISolone acetate (DEPO-MEDROL) injection 80 mg  80 mg Intramuscular Once Thao P Le, DO        Family History  Problem Relation Age of Onset  . Cancer - Lung Mother [id number]  . Cirrhosis Father [id number]  . Heart failure Maternal Grandmother   . Cancer Paternal Grandmother   .  Cancer Paternal Grandfather     ROS:  Pertinent items are noted in HPI.  Otherwise, a comprehensive ROS was negative.  Exam:   BP 110/82 mmHg  Pulse 96  Ht 5' 2.5" (1.588 m)  Wt 131 lb 9.6 oz (59.693 kg)  BMI 23.67 kg/m2  LMP 08/22/1999 Height: 5' 2.5" (158.8 cm) Ht Readings from Last 3 Encounters:  07/12/14 5' 2.5" (1.588 m)  04/27/14 5' 2.5" (1.588 m)  11/10/13  (1.575 m)    General appearance: alert, cooperative and appears stated age Head: Normocephalic, without obvious abnormality, atraumatic Neck: no adenopathy, supple, symmetrical, trachea midline and thyroid normal to inspection and palpation Lungs: clear to auscultation bilaterally Breasts: normal appearance, no masses or tenderness Heart: regular rate and rhythm Abdomen: soft, non-tender; no masses,  no organomegaly Extremities: extremities normal, atraumatic, no cyanosis or  edema Skin: Skin color, texture, turgor normal. No rashes or lesions Lymph nodes: Cervical, supraclavicular, and axillary nodes normal. No abnormal inguinal nodes palpated Neurologic: Grossly normal   Pelvic: External genitalia:  no lesions              Urethra:  normal appearing urethra with no masses, tenderness or lesions              Bartholin's and Skene's: normal                 Vagina: normal appearing vagina with normal color and discharge, no lesions              Cervix: absent              Pap taken: No. 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

## 2014-07-13 LAB — VITAMIN D 25 HYDROXY (VIT D DEFICIENCY, FRACTURES): Vit D, 25-Hydroxy: 46 ng/mL (ref 30–100)

## 2014-07-18 NOTE — Progress Notes (Signed)
Encounter reviewed by Dr. Brook Silva.  

## 2014-07-20 ENCOUNTER — Ambulatory Visit (INDEPENDENT_AMBULATORY_CARE_PROVIDER_SITE_OTHER): Payer: Medicare Other | Admitting: Pharmacist

## 2014-07-20 DIAGNOSIS — E7800 Pure hypercholesterolemia, unspecified: Secondary | ICD-10-CM

## 2014-07-20 MED ORDER — ROSUVASTATIN CALCIUM 5 MG PO TABS: 5.0000 mg | ORAL_TABLET | Freq: Every day | ORAL | Status: DC

## 2014-07-20 NOTE — Progress Notes (Signed)
[address] [place], [place]  [address] Phone: [phone] Fax:  [phone]  Date:  07/20/2014   ID:  Girard CooterRhea H Rasmussen, DOB December 16, 1945, MRN [id number]  PCP:  Hollice EspyGATES,DONNA RUTH, MD   History of Present Illness: Rebecca NettleRhea H Rasmussen is a 69 y.o. female with hyperlipidemia, difficult to control with prior myalgias.  Her PMH is noncontributory. She only got diagnosed with HTN about 2 months ago.  She has no family history of heart disease or stroke.  She is not a smoker.  She has no evidence of tendon xanthomas or corneal arcus.  She had been tolerating Crestor 10mg  three days of the week but called in October 2015 to report she had to stop due to muscle pains.  These resolved once she stopped the Crestor.  Since then, she has not been on any other cholesterol lowering therapy.      Pt has tried to make some lifestyle changes since being diagnosed with HTN.  She has started the DASH diet and decreased sweets.   RF: Age, HTN Goals: LDL <130, non-HDL <160 Current Therapy: none Intolerances: Zocor, Lipitor (required hospitalization), Crestor 10mg  three days of the week, Zetia (rash), Welchol (ineffective)   Labs:  06/2014: TC 331, TG 160, HDL 52, LDL 247 (no therapy)     Current Outpatient Prescriptions  Medication Sig Dispense Refill  . aspirin 81 MG tablet Take 81 mg by mouth 2 (two) times daily.    . calcium-vitamin D (CALCIUM + D) 250-125 MG-UNIT per tablet Take 1 tablet by mouth daily.    . celecoxib (CELEBREX) 200 MG capsule Take 1 capsule (200 mg total) by mouth 2 (two) times daily. PRN 60 capsule 0  . cycloSPORINE (RESTASIS) 0.05 % ophthalmic emulsion Place 1 drop into both eyes 2 (two) times daily.    . Glucosamine-Chondroit-Vit C-Mn (GLUCOSAMINE CHONDR 1500 COMPLX) CAPS Take by mouth daily.    . hydrochlorothiazide (HYDRODIURIL) 25 MG tablet Take 25 mg by mouth daily.    . Loratadine (CLARITIN) 10 MG CAPS Take by mouth as needed.    . Magnesium 400 MG CAPS Take  by mouth daily.    . Multiple Vitamins-Minerals (CENTRUM SPECIALIST HEART PO) Take by mouth 2 (two) times daily.    . Omega 3 1000 MG CAPS Take 1 capsule by mouth daily.    . Saline (SIMPLY SALINE) 0.9 % AERS Place into the nose.    . vitamin B-12 (CYANOCOBALAMIN) 1000 MCG tablet Take 1,000 mcg by mouth daily.    . vitamin C (ASCORBIC ACID) 500 MG tablet Take 500 mg by mouth daily.    . Vitamin D, Ergocalciferol, (DRISDOL) 50000 UNITS CAPS capsule Take 1 capsule (50,000 Units total) by mouth every 7 (seven) days. 30 capsule 3  . vitamin E 100 UNIT capsule Take 400 Units by mouth daily.     No current facility-administered medications for this visit.    Allergies:    Allergies  Allergen Reactions  . Zetia [Ezetimibe] Rash  . Crestor [Rosuvastatin Calcium] Other (See Comments)    Severe muscle pain at higher dose  . Darvocet [Propoxyphene N-Acetaminophen] Other (See Comments)    Doesn't remember reaction  . Lipitor [Atorvastatin Calcium] Other (See Comments)    Severe muscle pain  . Prednisone Other (See Comments)    Weight gain  . Promethazine Other (See Comments)    Pt doesn't remember reaction  . Vicodin [Hydrocodone-Acetaminophen] Nausea Only  . Zocor [Simvastatin - High  Dose] Other (See Comments)    Severe muscle pain  . Amoxicillin-Pot Clavulanate Rash  . Naprosyn [Naproxen] Rash  . Naproxen Sodium Rash    ASSESSMENT AND PLAN:  1. Hyperlipidemia- Pt's LDL quite elevated since off statin.  She has no history of ACSVD and a [place] Criteria Score of 3 so does not qualify for PCSK-9 inhibitors.  Discussed primary prevention data of statins versus other cholesterol lowering agents.  She is willing to try low dose Crestor again.  Will change to  once weekly.  This is a decrease of /week to /week so hopefully she will be able to tolerate.  If not, other option would be fenofibrate.  She is scheduled to have labs done with her PCP again soon so will follow up once those  results are available.   Signed, Audrie Lia, PharmD, CPP  07/20/2014 2:31 PM

## 2014-07-20 NOTE — Patient Instructions (Addendum)
Start Crestor 5mg  once a week.  If you have any problems, please call Kennon RoundsSally at [phone].   We will recheck your labs in 3 months.  If Dr. Katrinka BlazingSmith is not checking your labs again soon, please let me know and I will schedule them at our office.

## 2014-07-22 ENCOUNTER — Other Ambulatory Visit: Payer: Self-pay | Admitting: Gastroenterology

## 2014-07-22 DIAGNOSIS — R1310 Dysphagia, unspecified: Secondary | ICD-10-CM

## 2014-07-23 ENCOUNTER — Ambulatory Visit: Payer: Self-pay | Admitting: Pharmacist

## 2014-07-26 ENCOUNTER — Other Ambulatory Visit: Payer: Medicare Other

## 2014-07-28 ENCOUNTER — Ambulatory Visit
Admission: RE | Admit: 2014-07-28 | Discharge: 2014-07-28 | Disposition: A | Discharge status: Home / Self Care | Payer: Medicare Other | Source: Ambulatory Visit | Attending: Gastroenterology | Admitting: Gastroenterology

## 2014-07-28 DIAGNOSIS — R1310 Dysphagia, unspecified: Secondary | ICD-10-CM

## 2014-08-05 ENCOUNTER — Telehealth: Payer: Self-pay | Admitting: Cardiology

## 2014-08-05 NOTE — Telephone Encounter (Signed)
New message     Calling to see if Dr Caro Hight is seeing pt for cholesterol issues----refill medications, labs, ov, any cholesterol questions?   They did not want to do it and we do it also.  Please call

## 2014-08-05 NOTE — Telephone Encounter (Signed)
Left message for Rebecca Rasmussen that pt is being seen in our Lipid Clinic and is to repeat lab at the end of August.

## 2014-10-27 ENCOUNTER — Other Ambulatory Visit (INDEPENDENT_AMBULATORY_CARE_PROVIDER_SITE_OTHER): Payer: Medicare Other | Admitting: *Deleted

## 2014-10-27 DIAGNOSIS — E7800 Pure hypercholesterolemia, unspecified: Secondary | ICD-10-CM

## 2014-10-27 LAB — LIPID PANEL
CHOL/HDL RATIO: 6
Cholesterol: 257 mg/dL — ABNORMAL HIGH (ref 0–200)
HDL: 43.5 mg/dL (ref >39.00)
LDL Cholesterol: 177 mg/dL — ABNORMAL HIGH (ref 0–99)
NONHDL: 213.35
Triglycerides: 180 mg/dL — ABNORMAL HIGH (ref 0.0–149.0)
VLDL: 36 mg/dL (ref 0.0–40.0)

## 2014-10-27 LAB — HEPATIC FUNCTION PANEL
ALT: 23 U/L (ref 0–35)
AST: 20 U/L (ref 0–37)
Albumin: 4.3 g/dL (ref 3.5–5.2)
Alkaline Phosphatase: 58 U/L (ref 39–117)
BILIRUBIN TOTAL: 0.4 mg/dL (ref 0.2–1.2)
Bilirubin, Direct: 0.1 mg/dL (ref 0.0–0.3)
Total Protein: 6.8 g/dL (ref 6.0–8.3)

## 2014-10-28 ENCOUNTER — Telehealth: Payer: Self-pay

## 2014-10-28 NOTE — Telephone Encounter (Signed)
-----   Message from Jake Bathe, MD sent at 10/28/2014  6:12 AM EDT ----- LDL 177. Statin intol. Please have her referred to lipid clinic to discuss further options. Crestor 3x week does not seem adequate.  Donato Schultz, MD

## 2014-10-28 NOTE — Telephone Encounter (Signed)
Pt aware of lab results and Dr.Skains recommendation. LDL 177. Statin intol. Please have her referred to lipid clinic to discuss further options. Crestor 3x week does not seem adequate.  Pt already has a lipid appt scheduled on 9/12 with the lipid clinic. Adv pt to keep her appt. She verbalized understanding.

## 2014-11-01 ENCOUNTER — Ambulatory Visit (INDEPENDENT_AMBULATORY_CARE_PROVIDER_SITE_OTHER): Payer: Medicare Other | Admitting: Pharmacist

## 2014-11-01 DIAGNOSIS — E785 Hyperlipidemia, unspecified: Secondary | ICD-10-CM

## 2014-11-01 NOTE — Patient Instructions (Signed)
Try to increase Crestor to  two days of the week.  If you have any problems, drop back down to 1 tablet each week.   We will recheck your labs in 3-4 months.  Let us know you would like to do those here or with Dr. Kevan Ny.

## 2014-11-01 NOTE — Progress Notes (Signed)
[address] [place], [place]  [address] Phone: [phone] Fax:  [phone]  Date:  11/01/2014   ID:  Leonilda, Cozby 09/27/1945, MRN [id number]  PCP:  Hollice Espy, MD   History of Present Illness: Rebecca Rasmussen is a 69 y.o. female with hyperlipidemia, difficult to control with prior myalgias.  Her PMH is noncontributory. She only got diagnosed with HTN about 8 months ago.  She has no family history of heart disease or stroke.  She is not a smoker.  She has no evidence of tendon xanthomas or corneal arcus.  She had been tolerating Crestor 10mg  three days of the week but called in October 2015 to report she had to stop due to muscle pains.  These resolved once she stopped the Crestor.  In May, she was restarted on Crestor 5mg  once a week.  Pt states she is tolerating this with no issues.      Pt has tried to make some lifestyle changes since being diagnosed with HTN.  She has started the DASH diet and decreased sweets.  She has also been diagnosed with acid reflux and so she is watching trigger foods.    Pt is trying to walk more.  She is thinking about signing up for [employer].    RF: Age, HTN Goals: LDL <130, non-HDL <160 Current Therapy: none Intolerances: Zocor, Lipitor (required hospitalization), Crestor 10mg  three days of the week, Zetia (rash), Welchol (ineffective)   Labs:  10/2014: TC 257, TG 180, HDL 43, LDL 177, LFTs normal (Crestor 5mg  three days of the week) 06/2014: TC 331, TG 160, HDL 52, LDL 247 (no therapy)     Current Outpatient Prescriptions  Medication Sig Dispense Refill  . aspirin 81 MG tablet Take 81 mg by mouth 2 (two) times daily.    . calcium-vitamin D (CALCIUM + D) 250-125 MG-UNIT per tablet Take 1 tablet by mouth daily.    . celecoxib (CELEBREX) 200 MG capsule Take 1 capsule (200 mg total) by mouth 2 (two) times daily. PRN 60 capsule 0  . cycloSPORINE (RESTASIS) 0.05 % ophthalmic emulsion Place 1 drop into both eyes  2 (two) times daily.    . Glucosamine-Chondroit-Vit C-Mn (GLUCOSAMINE CHONDR 1500 COMPLX) CAPS Take by mouth daily.    . hydrochlorothiazide (HYDRODIURIL) 25 MG tablet Take 25 mg by mouth daily.    . Loratadine (CLARITIN) 10 MG CAPS Take by mouth as needed.    . Magnesium 400 MG CAPS Take by mouth daily.    . Multiple Vitamins-Minerals (CENTRUM SPECIALIST HEART PO) Take by mouth 2 (two) times daily.    . Omega 3 1000 MG CAPS Take 1 capsule by mouth daily.    . rosuvastatin (CRESTOR) 5 MG tablet Take 1 tablet (5 mg total) by mouth daily. Or as directed 30 tablet 0  . Saline (SIMPLY SALINE) 0.9 % AERS Place into the nose.    . vitamin B-12 (CYANOCOBALAMIN) 1000 MCG tablet Take 1,000 mcg by mouth daily.    . vitamin C (ASCORBIC ACID) 500 MG tablet Take 500 mg by mouth daily.    . Vitamin D, Ergocalciferol, (DRISDOL) 50000 UNITS CAPS capsule Take 1 capsule (50,000 Units total) by mouth every 7 (seven) days. 30 capsule 3  . vitamin E 100 UNIT capsule Take 400 Units by mouth daily.     No current facility-administered medications for this visit.    Allergies:    Allergies  Allergen Reactions  .  Zetia [Ezetimibe] Rash  . Crestor [Rosuvastatin Calcium] Other (See Comments)    Severe muscle pain at higher dose  . Darvocet [Propoxyphene N-Acetaminophen] Other (See Comments)    Doesn't remember reaction  . Lipitor [Atorvastatin Calcium] Other (See Comments)    Severe muscle pain  . Prednisone Other (See Comments)    Weight gain  . Promethazine Other (See Comments)    Pt doesn't remember reaction  . Vicodin [Hydrocodone-Acetaminophen] Nausea Only  . Zocor [Simvastatin - High Dose] Other (See Comments)    Severe muscle pain  . Amoxicillin-Pot Clavulanate Rash  . Naprosyn [Naproxen] Rash  . Naproxen Sodium Rash    ASSESSMENT AND PLAN:  Hyperlipidemia- Pt's LDL improved significantly with once weekly statin (LDL decreased 70 points).  Despite this improvement, LDL still above goal.  Pt is  willing to try to increase Crestor to 2 days of the week.   If this causes myalgias, will decrease back to once weekly.  Zetia not an option due to rash.  She has no history of ACSVD and a [place] Criteria Score of 3 so does not qualify for PCSK-9 inhibitors.  Will recheck labs in 3-4 months.   Signed, Audrie Lia, PharmD, CPP  11/01/2014 2:13 PM

## 2014-11-10 ENCOUNTER — Other Ambulatory Visit: Payer: Self-pay | Admitting: Pharmacist

## 2014-11-10 DIAGNOSIS — E7800 Pure hypercholesterolemia, unspecified: Secondary | ICD-10-CM

## 2014-11-18 ENCOUNTER — Ambulatory Visit (INDEPENDENT_AMBULATORY_CARE_PROVIDER_SITE_OTHER): Payer: Medicare Other | Admitting: Cardiology

## 2014-11-18 ENCOUNTER — Encounter: Payer: Self-pay | Admitting: Cardiology

## 2014-11-18 VITALS — BP 121/80 | HR 70 | Ht 62.0 in | Wt 130.0 lb

## 2014-11-18 DIAGNOSIS — Z889 Allergy status to unspecified drugs, medicaments and biological substances status: Secondary | ICD-10-CM

## 2014-11-18 DIAGNOSIS — R0789 Other chest pain: Secondary | ICD-10-CM

## 2014-11-18 DIAGNOSIS — E7800 Pure hypercholesterolemia, unspecified: Secondary | ICD-10-CM

## 2014-11-18 DIAGNOSIS — K219 Gastro-esophageal reflux disease without esophagitis: Secondary | ICD-10-CM | POA: Diagnosis not present

## 2014-11-18 DIAGNOSIS — Z789 Other specified health status: Secondary | ICD-10-CM

## 2014-11-18 NOTE — Progress Notes (Signed)
[address] [place], [place]  [address] Phone: [phone] Fax:  [phone]  Date:  11/18/2014   ID:  [name], [name] 1945/06/25, MRN [id number]  PCP:  Hollice Espy, MD   History of Present Illness: Rebecca Rasmussen is a 69 y.o. female with hyperlipidemia, difficult to control with prior myalgias.  On 02/27/13-LDL 142, triglycerides 97, ALT 28. Prior lipid panel showed an LDL of 163, triglycerides of 190. She has been tolerating Crestor 2 times a week. No myalgias. Doing well.Prior RASH on Zetia.  Hot flashes are less apparent currently, diminished headaches, post menopausal symptoms.  Lipid clinic notes reviewed. Lab work reviewed. No symptoms on twice weekly Crestor.   Wt Readings from Last 3 Encounters:  11/18/14 130 lb (58.968 kg)  07/12/14 131 lb 9.6 oz (59.693 kg)  04/27/14 135 lb (61.236 kg)     Past Medical History  Diagnosis Date  . Osteoporosis     osteopenia  . Menopause 2001    Took ERT 08/1999 - 2/ 2007  . Atrophic vaginitis   . Anxiety   . Osteopenia 07/2008  . DDD (degenerative disc disease), lumbar 05/2012    had a fall with sslipped disck 06/14/12  . Premature ovarian failure age 49    Past Surgical History  Procedure Laterality Date  . Abdominal hysterectomy  08/1999    secondary to fibroids  . Toe surgery Left about 2008    cyst removed under left great toe  . Eye surgery    . Appendectomy      Current Outpatient Prescriptions  Medication Sig Dispense Refill  . aspirin 81 MG tablet Take 81 mg by mouth 2 (two) times daily.    . calcium-vitamin D (CALCIUM + D) 250-125 MG-UNIT per tablet Take 1 tablet by mouth daily.    . celecoxib (CELEBREX) 200 MG capsule Take 1 capsule (200 mg total) by mouth 2 (two) times daily. PRN 60 capsule 0  . cycloSPORINE (RESTASIS) 0.05 % ophthalmic emulsion Place 1 drop into both eyes 2 (two) times daily.    . Glucosamine-Chondroit-Vit C-Mn (GLUCOSAMINE CHONDR 1500 COMPLX) CAPS Take by mouth  daily.    . hydrochlorothiazide (HYDRODIURIL) 25 MG tablet Take 25 mg by mouth daily.    . Loratadine (CLARITIN) 10 MG CAPS Take by mouth as needed.    . Magnesium 400 MG CAPS Take by mouth daily.    . Multiple Vitamins-Minerals (CENTRUM SPECIALIST HEART PO) Take by mouth 2 (two) times daily.    . Omega 3 1000 MG CAPS Take 1 capsule by mouth daily.    Marland Kitchen omeprazole (PRILOSEC) 20 MG capsule     . rosuvastatin (CRESTOR) 5 MG tablet Take 1 tablet (5 mg total) by mouth daily. Or as directed 30 tablet 0  . Saline (SIMPLY SALINE) 0.9 % AERS Place into the nose.    . vitamin B-12 (CYANOCOBALAMIN) 1000 MCG tablet Take 1,000 mcg by mouth daily.    . vitamin C (ASCORBIC ACID) 500 MG tablet Take 500 mg by mouth daily.    . Vitamin D, Ergocalciferol, (DRISDOL) 50000 UNITS CAPS capsule Take 1 capsule (50,000 Units total) by mouth every 7 (seven) days. 30 capsule 3  . vitamin E 100 UNIT capsule Take 400 Units by mouth daily.     No current facility-administered medications for this visit.    Allergies:    Allergies  Allergen Reactions  . Zetia [Ezetimibe] Rash  . Crestor [Rosuvastatin Calcium] Other (  See Comments)    Severe muscle pain at higher dose  . Darvocet [Propoxyphene N-Acetaminophen] Other (See Comments)    Doesn't remember reaction  . Lipitor [Atorvastatin Calcium] Other (See Comments)    Severe muscle pain  . Prednisone Other (See Comments)    Weight gain  . Promethazine Other (See Comments)    Pt doesn't remember reaction  . Vicodin [Hydrocodone-Acetaminophen] Nausea Only  . Zocor [Simvastatin - High Dose] Other (See Comments)    Severe muscle pain  . Amoxicillin-Pot Clavulanate Rash  . Naprosyn [Naproxen] Rash  . Naproxen Sodium Rash    Social History:  The patient  reports that she has never smoked. She has never used smokeless tobacco. She reports that she does not drink alcohol or use illicit drugs.   Family History  Problem Relation Age of Onset  . Cancer - Lung Mother 70   . Cirrhosis Father 34  . Heart failure Maternal Grandmother   . Cancer Paternal Grandmother   . Cancer Paternal Grandfather     ROS:  Please see the history of present illness.   Denies any fevers, chills, orthopnea, PND   All other systems reviewed and negative.   PHYSICAL EXAM: VS:  BP 121/80 mmHg  Pulse 70  Ht  (1.575 m)  Wt 130 lb (58.968 kg)  BMI 23.77 kg/m2  SpO2 97%  LMP 08/22/1999 Well nourished, well developed, in no acute distress HEENT: normal, [place]/AT, EOMI Neck: no JVD, normal carotid upstroke, no bruit Cardiac:  normal S1, S2; RRR; no murmur Lungs:  clear to auscultation bilaterally, no wheezing, rhonchi or rales Abd: soft, nontender, no hepatomegaly, no bruits Ext: no edema, 2+ distal pulses Skin: warm and dry GU: deferred Neuro: no focal abnormalities noted, AAO x 3  EKG:  10/28/13-sinus rhythm, 82, peak T waves in lead to, right atrial enlargement, no ST segment changes.     NUC 11/10/13 - low risk.  10/27/14-LDL 177 on once a week Crestor  ASSESSMENT AND PLAN:  1. Chest pain- nuclear stress test in 2015 was reassuring. This was GERD, hiatal hernia. She is feeling much better on changed diet and medication.. 2. Hyperlipidemia-LDL currently 177. It has been much higher in the past. She has been only able to tolerate Crestor at low doses, she is doing well with 2 days a week. She realizes that she can differentiate the difference between leg pain/back pain and possible myalgias from statins.. She is doing well with this. No longer on WelChol. 3. Statin intolerance-she's had issues with myalgias in the past with higher doses of statin, more frequent doses. Doing well on current 2 days a week Crestor. Appreciate the assistance of Audrie Lia, [place].D. and the lipid clinic. She will continue to follow-up with them. 4. One-year followup.  Signed, Donato Schultz, MD [facility]  11/18/2014 10:02 AM

## 2014-11-18 NOTE — Patient Instructions (Signed)
Medication Instructions:  The current medical regimen is effective;  continue present plan and medications.  Follow-Up: Follow up in 1 year with Dr. Skains.  You will receive a letter in the mail 2 months before you are due.  Please call us when you receive this letter to schedule your follow up appointment.  Thank you for choosing [facility]!!     

## 2014-11-23 ENCOUNTER — Other Ambulatory Visit: Payer: Self-pay

## 2014-11-23 DIAGNOSIS — Z1231 Encounter for screening mammogram for malignant neoplasm of breast: Secondary | ICD-10-CM

## 2014-12-24 ENCOUNTER — Other Ambulatory Visit: Payer: Self-pay | Admitting: Cardiology

## 2014-12-24 ENCOUNTER — Telehealth: Payer: Self-pay | Admitting: Cardiology

## 2014-12-24 NOTE — Telephone Encounter (Signed)
*  STAT* If patient is at the pharmacy, call can be transferred to refill team.  Pt was just seen 9/29 w/ Dr Anne FuSkains- she stated pharmacy told her therewere no more refills. Please call back and discuss.   1. Which medications need to be refilled? (please list name of each medication and dose if known) generic crestor   2. Which pharmacy/location (including street and city if local pharmacy) is medication to be sent to?[facility] [place]  3. Do they need a 30 day or 90 day supply? Pt thinks 30

## 2014-12-27 ENCOUNTER — Ambulatory Visit
Admission: RE | Admit: 2014-12-27 | Discharge: 2014-12-27 | Disposition: A | Discharge status: Home / Self Care | Payer: Medicare Other | Source: Ambulatory Visit

## 2014-12-27 DIAGNOSIS — Z1231 Encounter for screening mammogram for malignant neoplasm of breast: Secondary | ICD-10-CM

## 2014-12-30 ENCOUNTER — Encounter: Payer: Self-pay | Admitting: Cardiology

## 2015-02-07 ENCOUNTER — Other Ambulatory Visit (INDEPENDENT_AMBULATORY_CARE_PROVIDER_SITE_OTHER): Payer: Medicare Other | Admitting: *Deleted

## 2015-02-07 DIAGNOSIS — E7800 Pure hypercholesterolemia, unspecified: Secondary | ICD-10-CM | POA: Diagnosis not present

## 2015-02-07 LAB — HEPATIC FUNCTION PANEL
ALBUMIN: 4.2 g/dL (ref 3.6–5.1)
ALT: 20 U/L (ref 6–29)
AST: 21 U/L (ref 10–35)
Alkaline Phosphatase: 51 U/L (ref 33–130)
BILIRUBIN TOTAL: 0.5 mg/dL (ref 0.2–1.2)
Bilirubin, Direct: 0.1 mg/dL (ref <=0.2)
Indirect Bilirubin: 0.4 mg/dL (ref 0.2–1.2)
Total Protein: 6.9 g/dL (ref 6.1–8.1)

## 2015-02-07 LAB — LIPID PANEL
CHOL/HDL RATIO: 5.2 ratio — AB (ref <=5.0)
CHOLESTEROL: 236 mg/dL — AB (ref 125–200)
HDL: 45 mg/dL — ABNORMAL LOW (ref >=46)
LDL Cholesterol: 168 mg/dL — ABNORMAL HIGH (ref <130)
TRIGLYCERIDES: 115 mg/dL (ref <150)
VLDL: 23 mg/dL (ref <30)

## 2015-03-23 ENCOUNTER — Ambulatory Visit (INDEPENDENT_AMBULATORY_CARE_PROVIDER_SITE_OTHER): Payer: Medicare Other | Admitting: Family Medicine

## 2015-03-23 ENCOUNTER — Encounter: Payer: Self-pay | Admitting: Family Medicine

## 2015-03-23 VITALS — BP 132/70 | HR 95 | Temp 99.9°F | Resp 16 | Ht 62.0 in | Wt 130.4 lb

## 2015-03-23 DIAGNOSIS — R05 Cough: Secondary | ICD-10-CM

## 2015-03-23 DIAGNOSIS — R509 Fever, unspecified: Secondary | ICD-10-CM | POA: Diagnosis not present

## 2015-03-23 DIAGNOSIS — R059 Cough, unspecified: Secondary | ICD-10-CM

## 2015-03-23 MED ORDER — LEVOFLOXACIN 500 MG PO TABS: 500.0000 mg | ORAL_TABLET | Freq: Every day | ORAL | Status: DC

## 2015-03-23 NOTE — Progress Notes (Signed)
   Subjective:    Patient ID: Rebecca Rasmussen, female    DOB: March 26, 1945, 70 y.o.   MRN: [id number] By signing my name below, I, Littie Deeds, attest that this documentation has been prepared under the direction and in the presence of Elvina Sidle, MD.  Electronically Signed: Littie Deeds, Medical Scribe. 03/23/2015. 1:21 PM.  HPI HPI Comments: Rebecca Rasmussen is a 70 y.o. female who presents to the [facility] complaining of gradual onset, worsening, dry cough that started 2 days ago, but worsened yesterday. Patient reports having associated rhinorrhea. She denies SOB. She did receive a flu shot this year.   Patient is retired, but still does part-time work.  Review of Systems  HENT: Positive for rhinorrhea.   Respiratory: Positive for cough. Negative for shortness of breath.        Objective:   Physical Exam CONSTITUTIONAL: Well developed/well nourished HEAD: Normocephalic/atraumatic EYES: EOM/PERRL ENMT: Mucous membranes moist NECK: supple no meningeal signs SPINE: entire spine nontender CV: S1/S2 noted, no murmurs/rubs/gallops noted LUNGS: Rales in right lung ABDOMEN: soft, nontender, no rebound or guarding GU: no cva tenderness NEURO: Pt is awake/alert, moves all extremitiesx4 EXTREMITIES: pulses normal, full ROM SKIN: warm, color normal PSYCH: no abnormalities of mood noted        Assessment & Plan:   This chart was scribed in my presence and reviewed by me personally.    ICD-9-CM ICD-10-CM   1. Cough 786.2 R05 levofloxacin (LEVAQUIN) 500 MG tablet  2. Fever, unspecified fever cause 780.60 R50.9 levofloxacin (LEVAQUIN) 500 MG tablet     Signed, Elvina Sidle, MD

## 2015-04-22 ENCOUNTER — Ambulatory Visit (INDEPENDENT_AMBULATORY_CARE_PROVIDER_SITE_OTHER): Payer: Medicare Other | Admitting: Family Medicine

## 2015-04-22 VITALS — BP 130/68 | HR 81 | Temp 99.4°F | Resp 16 | Ht 62.0 in | Wt 134.0 lb

## 2015-04-22 DIAGNOSIS — J301 Allergic rhinitis due to pollen: Secondary | ICD-10-CM

## 2015-04-22 DIAGNOSIS — S29011A Strain of muscle and tendon of front wall of thorax, initial encounter: Secondary | ICD-10-CM

## 2015-04-22 DIAGNOSIS — J069 Acute upper respiratory infection, unspecified: Secondary | ICD-10-CM

## 2015-04-22 LAB — POCT CBC
Granulocyte percent: 56.4 %G (ref 37–80)
HCT, POC: 38.1 % (ref 37.7–47.9)
HEMOGLOBIN: 13 g/dL (ref 12.2–16.2)
Lymph, poc: 1.6 (ref 0.6–3.4)
MCH: 30.2 pg (ref 27–31.2)
MCHC: 34 g/dL (ref 31.8–35.4)
MCV: 88.8 fL (ref 80–97)
MID (CBC): 0.3 (ref 0–0.9)
MPV: 6.5 fL (ref 0–99.8)
PLATELET COUNT, POC: 201 10*3/uL (ref 142–424)
POC Granulocyte: 2.5 (ref 2–6.9)
POC LYMPH PERCENT: 36.3 %L (ref 10–50)
POC MID %: 7.3 %M (ref 0–12)
RBC: 4.29 M/uL (ref 4.04–5.48)
RDW, POC: 13.3 %
WBC: 4.4 10*3/uL — AB (ref 4.6–10.2)

## 2015-04-22 LAB — POCT INFLUENZA A/B
INFLUENZA A, POC: NEGATIVE
INFLUENZA B, POC: NEGATIVE

## 2015-04-22 MED ORDER — AZELASTINE HCL 0.1 % NA SOLN: 2.0000 | Freq: Two times a day (BID) | NASAL | Status: DC

## 2015-04-22 MED ORDER — FLUTICASONE PROPIONATE 50 MCG/ACT NA SUSP: 2.0000 | Freq: Every day | NASAL | Status: DC

## 2015-04-22 NOTE — Patient Instructions (Signed)
Allergic Rhinitis Allergic rhinitis is when the mucous membranes in the nose respond to allergens. Allergens are particles in the air that cause your body to have an allergic reaction. This causes you to release allergic antibodies. Through a chain of events, these eventually cause you to release histamine into the blood stream. Although meant to protect the body, it is this release of histamine that causes your discomfort, such as frequent sneezing, congestion, and an itchy, runny nose.  CAUSES Seasonal allergic rhinitis (hay fever) is caused by pollen allergens that may come from grasses, trees, and weeds. Year-round allergic rhinitis (perennial allergic rhinitis) is caused by allergens such as house dust mites, pet dander, and mold spores. SYMPTOMS  Nasal stuffiness (congestion).  Itchy, runny nose with sneezing and tearing of the eyes. DIAGNOSIS Your health care provider can help you determine the allergen or allergens that trigger your symptoms. If you and your health care provider are unable to determine the allergen, skin or blood testing may be used. Your health care provider will diagnose your condition after taking your health history and performing a physical exam. Your health care provider may assess you for other related conditions, such as asthma, pink eye, or an ear infection. TREATMENT Allergic rhinitis does not have a cure, but it can be controlled by:  Medicines that block allergy symptoms. These may include allergy shots, nasal sprays, and oral antihistamines.  Avoiding the allergen. Hay fever may often be treated with antihistamines in pill or nasal spray forms. Antihistamines block the effects of histamine. There are over-the-counter medicines that may help with nasal congestion and swelling around the eyes. Check with your health care provider before taking or giving this medicine. If avoiding the allergen or the medicine prescribed do not work, there are many new medicines  your health care provider can prescribe. Stronger medicine may be used if initial measures are ineffective. Desensitizing injections can be used if medicine and avoidance does not work. Desensitization is when a patient is given ongoing shots until the body becomes less sensitive to the allergen. Make sure you follow up with your health care provider if problems continue. HOME CARE INSTRUCTIONS It is not possible to completely avoid allergens, but you can reduce your symptoms by taking steps to limit your exposure to them. It helps to know exactly what you are allergic to so that you can avoid your specific triggers. SEEK MEDICAL CARE IF:  You have a fever.  You develop a cough that does not stop easily (persistent).  You have shortness of breath.  You start wheezing.  Symptoms interfere with normal daily activities.   This information is not intended to replace advice given to you by your health care provider. Make sure you discuss any questions you have with your health care provider.   Document Released: 10/31/2000 Document Revised: 02/26/2014 Document Reviewed: 10/13/2012 Elsevier Interactive Patient Education 2016 [employer]  

## 2015-04-22 NOTE — Progress Notes (Signed)
Subjective:    Patient ID: Rebecca Rasmussen, female    DOB: [birth year], 70 y.o.   MRN: [id number]  04/22/2015  Nasal Congestion and Chest Pain   HPI This 70 y.o. female presents for evaluation of nasal congestion.  Onset two days ago.  Started with sneezing and rhinorrhea.  No fever; no chills/sweats.  +body aches in L anterior chest and posterior chest moderate to severe one day ago.  Constant but was intermittent in beginning.  +PND.  Pain with movement.  No headache; no ear pain or sore throat.  +rhinorrhea; +nasal congestion; +PND; no cough.   No n/v/d.   Taking Mucinex and Benadryl.  No sick contacts.  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  S/p flu vaccine.  Taking Claritin daily at this time.     Evaluated on 03/23/15 by Dr. Milus Glazier; treated with Levaquin .      Review of Systems Per HPI   Past Medical History  Diagnosis Date  . Osteoporosis     osteopenia  . Menopause 2001    Took ERT 08/1999 - 2/ 2007  . Atrophic vaginitis   . Anxiety   . Osteopenia 07/2008  . DDD (degenerative disc disease), lumbar 05/2012    had a fall with sslipped disck 06/14/12  . Premature ovarian failure age 40   Past Surgical History  Procedure Laterality Date  . Abdominal hysterectomy  08/1999    secondary to fibroids  . Toe surgery Left about 2008    cyst removed under left great toe  . Eye surgery    . Appendectomy     Allergies  Allergen Reactions  . Zetia [Ezetimibe] Rash  . Crestor [Rosuvastatin Calcium] Other (See Comments)    Severe muscle pain at higher dose  . Darvocet [Propoxyphene N-Acetaminophen] Other (See Comments)    Doesn't remember reaction  . Lipitor [Atorvastatin Calcium] Other (See Comments)    Severe muscle pain  . Prednisone Other (See Comments)    Weight gain  . Promethazine Other (See Comments)    Pt doesn't remember reaction  . Vicodin [Hydrocodone-Acetaminophen] Nausea Only  . Zocor [Simvastatin - High Dose] Other (See Comments)    Severe muscle pain    . Amoxicillin-Pot Clavulanate Rash  . Naprosyn [Naproxen] Rash  . Naproxen Sodium Rash    Social History   Social History  . Marital Status: Married    Spouse Name: N/A  . Number of Children: N/A  . Years of Education: N/A   Occupational History  . Not on file.   Social History Main Topics  . Smoking status: Never Smoker   . Smokeless tobacco: Never Used  . Alcohol Use: No  . Drug Use: No  . Sexual Activity: No   Other Topics Concern  . Not on file   Social History Narrative   Family History  Problem Relation Age of Onset  . Cancer - Lung Mother 75  . Cirrhosis Father 110  . Heart failure Maternal Grandmother   . Cancer Paternal Grandmother   . Cancer Paternal Grandfather        Objective:    BP 130/68 mmHg  Pulse 81  Temp(Src) 99.4 F (37.4 C) (Oral)  Resp 16  Ht  (1.575 m)  Wt 134 lb (60.782 kg)  BMI 24.50 kg/m2  SpO2 97%  LMP 08/22/1999 Physical Exam  Constitutional: She is oriented to person, place, and time. She appears well-developed and well-nourished. No distress.  HENT:  Head: Normocephalic and atraumatic.  Right Ear: External ear normal.  Left Ear: External ear normal.  Nose: Nose normal.  Mouth/Throat: Oropharynx is clear and moist.  Eyes: Conjunctivae and EOM are normal. Pupils are equal, round, and reactive to light.  Neck: Normal range of motion. Neck supple. Carotid bruit is not present. No thyromegaly present.  Cardiovascular: Normal rate, regular rhythm, normal heart sounds and intact distal pulses.  Exam reveals no gallop and no friction rub.   No murmur heard. Pulmonary/Chest: Effort normal and breath sounds normal. She has no wheezes. She has no rales. She exhibits tenderness.  Abdominal: Soft. Bowel sounds are normal. She exhibits no distension and no mass. There is no tenderness. There is no rebound and no guarding.  Lymphadenopathy:    She has no cervical adenopathy.  Neurological: She is alert and oriented to person,  place, and time. No cranial nerve deficit.  Skin: Skin is warm and dry. No rash noted. She is not diaphoretic. No erythema. No pallor.  Psychiatric: She has a normal mood and affect. Her behavior is normal.   Results for orders placed or performed in visit on 04/22/15  POCT CBC  Result Value Ref Range   WBC 4.4 (A) 4.6 - 10.2 K/uL   Lymph, poc 1.6 0.6 - 3.4   POC LYMPH PERCENT 36.3 10 - 50 %L   MID (cbc) 0.3 0 - 0.9   POC MID % 7.3 0 - 12 %M   POC Granulocyte 2.5 2 - 6.9   Granulocyte percent 56.4 37 - 80 %G   RBC 4.29 4.04 - 5.48 M/uL   Hemoglobin 13.0 12.2 - 16.2 g/dL   HCT, POC 16.138.1 09.637.7 - 47.9 %   MCV 88.8 80 - 97 fL   MCH, POC 30.2 27 - 31.2 pg   MCHC 34.0 31.8 - 35.4 g/dL   RDW, POC 04.513.3 %   Platelet Count, POC 201 142 - 424 K/uL   MPV 6.5 0 - 99.8 fL  POCT Influenza A/B  Result Value Ref Range   Influenza A, POC Negative Negative   Influenza B, POC Negative Negative       Assessment & Plan:   1. Acute upper respiratory infection   2. Seasonal allergic rhinitis due to pollen   3. Chest wall muscle strain, initial encounter     Orders Placed This Encounter  Procedures  . POCT CBC  . POCT Influenza A/B   Meds ordered this encounter  Medications  . fluticasone (FLONASE) 50 MCG/ACT nasal spray    Sig: Place 2 sprays into both nostrils daily.    Dispense:  16 g    Refill:  6  . azelastine (ASTELIN) 0.1 % nasal spray    Sig: Place 2 sprays into both nostrils 2 (two) times daily.    Dispense:  30 mL    Refill:  12    No Follow-up on file.    Kristi Paulita FujitaMartin Smith, M.D. [facility]  [place] [address] [place], [place]  [address] [phone] phone [phone] fax

## 2015-06-23 ENCOUNTER — Other Ambulatory Visit: Payer: Self-pay | Admitting: Gastroenterology

## 2015-06-23 DIAGNOSIS — R1012 Left upper quadrant pain: Secondary | ICD-10-CM

## 2015-07-01 ENCOUNTER — Ambulatory Visit
Admission: RE | Admit: 2015-07-01 | Discharge: 2015-07-01 | Disposition: A | Discharge status: Home / Self Care | Payer: Medicare Other | Source: Ambulatory Visit | Attending: Gastroenterology | Admitting: Gastroenterology

## 2015-07-01 DIAGNOSIS — R1012 Left upper quadrant pain: Secondary | ICD-10-CM

## 2015-07-15 ENCOUNTER — Encounter: Payer: Self-pay | Admitting: Nurse Practitioner

## 2015-07-15 ENCOUNTER — Ambulatory Visit (INDEPENDENT_AMBULATORY_CARE_PROVIDER_SITE_OTHER): Payer: Medicare Other | Admitting: Nurse Practitioner

## 2015-07-15 VITALS — BP 130/60 | HR 76 | Ht 61.5 in | Wt 128.0 lb

## 2015-07-15 DIAGNOSIS — E559 Vitamin D deficiency, unspecified: Secondary | ICD-10-CM

## 2015-07-15 DIAGNOSIS — Z205 Contact with and (suspected) exposure to viral hepatitis: Secondary | ICD-10-CM | POA: Diagnosis not present

## 2015-07-15 DIAGNOSIS — Z01419 Encounter for gynecological examination (general) (routine) without abnormal findings: Secondary | ICD-10-CM

## 2015-07-15 MED ORDER — VITAMIN D (ERGOCALCIFEROL) 1.25 MG (50000 UNIT) PO CAPS: 50000.0000 [IU] | ORAL_CAPSULE | ORAL | Status: DC

## 2015-07-15 NOTE — Progress Notes (Signed)
Patient ID: Rebecca Rasmussen, female   DOB: 1945-04-06, 70 y.o.   MRN: [id number]  70 y.o. G0P0 Widowed  African American Fe here for annual exam.  No new health problems Husband age 69 died sudenly of acute MI from a DVT from his leg 02/2015.  No prior history of heart.  Patient's last menstrual period was 08/22/1999 (exact date).          Sexually active: No.  The current method of family planning is post menopausal status.    Exercising: Yes.    walking at work (school), and walking dogs Smoker:  no  Health Maintenance: Pap: 05/07/2001 wnl MMG: 12/28/2014 bi-rads 1: negative Colonoscopy: 06/03/14, Tubular Adenoma x 2, repeat in 3 years BMD:06/29/13 T Score: -0.4 Spine / -0.8 Left Femur Neck TDaP: Not sure Shingles: 07/01/2014 Pneumonia: not sure Hep C:  done today Labs: PCP   Urine:    reports that she has never smoked. She has never used smokeless tobacco. She reports that she does not drink alcohol or use illicit drugs.  Past Medical History  Diagnosis Date  . Osteoporosis     osteopenia  . Menopause 2001    Took ERT 08/1999 - 2/ 2007  . Atrophic vaginitis   . Anxiety   . Osteopenia 07/2008  . DDD (degenerative disc disease), lumbar 05/2012    had a fall with sslipped disck 06/14/12  . Premature ovarian failure age 38    Past Surgical History  Procedure Laterality Date  . Abdominal hysterectomy  08/1999    secondary to fibroids  . Toe surgery Left about 2008    cyst removed under left great toe  . Eye surgery    . Appendectomy      Current Outpatient Prescriptions  Medication Sig Dispense Refill  . aspirin 81 MG tablet Take 81 mg by mouth 2 (two) times daily.    . Calcium Carbonate-Vitamin D (CALCIUM 600+D) 600-200 MG-UNIT TABS Take 1 tablet by mouth 2 (two) times daily.    . celecoxib (CELEBREX) 200 MG capsule Take 1 capsule (200 mg total) by mouth 2 (two) times daily. PRN (Patient taking differently: Take 200 mg by mouth once. PRN) 60 capsule 0  . cycloSPORINE  (RESTASIS) 0.05 % ophthalmic emulsion Place 1 drop into both eyes 2 (two) times daily.    . fluticasone (FLONASE) 50 MCG/ACT nasal spray Place 2 sprays into both nostrils daily. 16 g 6  . Glucosamine-Chondroit-Vit C-Mn (GLUCOSAMINE CHONDR 1500 COMPLX) CAPS Take by mouth daily.    . hydrochlorothiazide (HYDRODIURIL) 25 MG tablet Take 25 mg by mouth daily.    . Loratadine (CLARITIN) 10 MG CAPS Take by mouth as needed.    . Magnesium 400 MG CAPS Take by mouth daily.    . Multiple Vitamin (MULTIVITAMIN) tablet Take 1 tablet by mouth daily.    . Omega 3 1000 MG CAPS Take 1 capsule by mouth daily.    . rosuvastatin (CRESTOR) 5 MG tablet TAKE ONE TABLET BY MOUTH ONCE DAILY OR AS DIRECTED 30 tablet 10  . Saline (SIMPLY SALINE) 0.9 % AERS Place into the nose.    . vitamin B-12 (CYANOCOBALAMIN) 1000 MCG tablet Take 1,000 mcg by mouth daily.    . vitamin C (ASCORBIC ACID) 500 MG tablet Take 500 mg by mouth daily.    . Vitamin D, Ergocalciferol, (DRISDOL) 50000 units CAPS capsule Take 1 capsule (50,000 Units total) by mouth every 7 (seven) days. 30 capsule 3  . vitamin E 100 UNIT  capsule Take 400 Units by mouth daily.     No current facility-administered medications for this visit.    Family History  Problem Relation Age of Onset  . Cancer - Lung Mother 2979  . Cirrhosis Father 860  . Heart failure Maternal Grandmother   . Cancer Paternal Grandmother   . Cancer Paternal Grandfather     ROS:  Pertinent items are noted in HPI.  Otherwise, a comprehensive ROS was negative.  Exam:   BP 130/60 mmHg  Pulse 76  Ht 5' 1.5" (1.562 m)  Wt 128 lb (58.06 kg)  BMI 23.80 kg/m2  LMP 08/22/1999 (Exact Date) Height: 5' 1.5" (156.2 cm) Ht Readings from Last 3 Encounters:  07/15/15 5' 1.5" (1.562 m)  04/22/15 5\' 2"  (1.575 m)  03/23/15 5\' 2"  (1.575 m)    General appearance: alert, cooperative and appears stated age Head: Normocephalic, without obvious abnormality, atraumatic Neck: no adenopathy, supple,  symmetrical, trachea midline and thyroid normal to inspection and palpation Lungs: clear to auscultation bilaterally Breasts: normal appearance, no masses or tenderness Heart: regular rate and rhythm Abdomen: soft, non-tender; no masses,  no organomegaly Extremities: extremities normal, atraumatic, no cyanosis or edema Skin: Skin color, texture, turgor normal. No rashes or lesions Lymph nodes: Cervical, supraclavicular, and axillary nodes normal. No abnormal inguinal nodes palpated Neurologic: Grossly normal   Pelvic: External genitalia:  no lesions              Urethra:  normal appearing urethra with no masses, tenderness or lesions              Bartholin's and Skene's: normal                 Vagina: normal appearing vagina with normal color and discharge, no lesions              Cervix: absent              Pap taken: No. Bimanual Exam:  Uterus:  uterus absent              Adnexa: no mass, fullness, tenderness               Rectovaginal: Confirms               Anus:  normal sphincter tone, no lesions  Chaperone present: no  A:  Well Woman with normal exam  S/P TAH /BSO 08/22/1999 secondary to fibroids History of POF - on HRT age 70 - 2001, then ERT from 08/1999 - 03/2005  History of Vit D  defiecency   P:   Reviewed health and wellness pertinent to exam  Pap smear as above  Mammogram is due 12/2015  Refill on Vit D and follow with labs  Counseled on breast self exam, mammography screening, adequate intake of calcium and vitamin D, diet and exercise, Kegel's exercises return annually or prn  An After Visit Summary was printed and given to the patient.

## 2015-07-15 NOTE — Patient Instructions (Addendum)

## 2015-07-16 LAB — VITAMIN D 25 HYDROXY (VIT D DEFICIENCY, FRACTURES): VIT D 25 HYDROXY: 53 ng/mL (ref 30–100)

## 2015-07-16 LAB — HEPATITIS C ANTIBODY: HCV Ab: NEGATIVE

## 2015-07-18 NOTE — Progress Notes (Signed)
Encounter reviewed by Dr. Brook Amundson C. Silva.  

## 2015-08-05 ENCOUNTER — Ambulatory Visit (INDEPENDENT_AMBULATORY_CARE_PROVIDER_SITE_OTHER): Payer: Medicare Other

## 2015-08-05 ENCOUNTER — Ambulatory Visit (INDEPENDENT_AMBULATORY_CARE_PROVIDER_SITE_OTHER): Payer: Medicare Other | Admitting: Family Medicine

## 2015-08-05 VITALS — BP 142/68 | HR 95 | Temp 99.8°F | Resp 16 | Ht 61.5 in | Wt 128.4 lb

## 2015-08-05 DIAGNOSIS — R05 Cough: Secondary | ICD-10-CM

## 2015-08-05 DIAGNOSIS — R059 Cough, unspecified: Secondary | ICD-10-CM

## 2015-08-05 DIAGNOSIS — J209 Acute bronchitis, unspecified: Secondary | ICD-10-CM

## 2015-08-05 MED ORDER — AZITHROMYCIN 250 MG PO TABS: ORAL_TABLET | ORAL | Status: DC

## 2015-08-05 MED ORDER — BENZONATATE 100 MG PO CAPS: 100.0000 mg | ORAL_CAPSULE | Freq: Three times a day (TID) | ORAL | Status: DC | PRN

## 2015-08-05 MED ORDER — ALBUTEROL SULFATE 108 (90 BASE) MCG/ACT IN AEPB
2.0000 | INHALATION_SPRAY | RESPIRATORY_TRACT | Status: DC | PRN
Start: 2015-08-05 — End: 2022-07-26

## 2015-08-05 MED ORDER — ALBUTEROL SULFATE (2.5 MG/3ML) 0.083% IN NEBU
2.5000 mg | INHALATION_SOLUTION | Freq: Once | RESPIRATORY_TRACT | Status: AC
  Administered 2015-08-05: 2.5 mg via RESPIRATORY_TRACT

## 2015-08-05 NOTE — Patient Instructions (Addendum)
   IF you received an x-ray today, you will receive an invoice from [facility]. Please contact [facility] at [phone] with questions or concerns regarding your invoice.   IF you received labwork today, you will receive an invoice from [facility]. Please contact [facility] at [phone] with questions or concerns regarding your invoice.   Our billing staff will not be able to assist you with questions regarding bills from these companies.  You will be contacted with the lab results as soon as they are available. The fastest way to get your results is to activate your My Chart account. Instructions are located on the last page of this paperwork. If you have not heard from us regarding the results in 2 weeks, please contact this office.    Acute Bronchitis Bronchitis is inflammation of the airways that extend from the windpipe into the lungs (bronchi). The inflammation often causes mucus to develop. This leads to a cough, which is the most common symptom of bronchitis.  In acute bronchitis, the condition usually develops suddenly and goes away over time, usually in a couple weeks. Smoking, allergies, and asthma can make bronchitis worse. Repeated episodes of bronchitis may cause further lung problems.  CAUSES Acute bronchitis is most often caused by the same virus that causes a cold. The virus can spread from person to person (contagious) through coughing, sneezing, and touching contaminated objects. SIGNS AND SYMPTOMS   Cough.   Fever.   Coughing up mucus.   Body aches.   Chest congestion.   Chills.   Shortness of breath.   Sore throat.  DIAGNOSIS  Acute bronchitis is usually diagnosed through a physical exam. Your health care provider will also ask you questions about your medical history. Tests, such as chest X-rays, are sometimes done to rule out other conditions.  TREATMENT  Acute bronchitis usually goes away in a  couple weeks. Oftentimes, no medical treatment is necessary. Medicines are sometimes given for relief of fever or cough. Antibiotic medicines are usually not needed but may be prescribed in certain situations. In some cases, an inhaler may be recommended to help reduce shortness of breath and control the cough. A cool mist vaporizer may also be used to help thin bronchial secretions and make it easier to clear the chest.  HOME CARE INSTRUCTIONS  Get plenty of rest.   Drink enough fluids to keep your urine clear or pale yellow (unless you have a medical condition that requires fluid restriction). Increasing fluids may help thin your respiratory secretions (sputum) and reduce chest congestion, and it will prevent dehydration.   Take medicines only as directed by your health care provider.  If you were prescribed an antibiotic medicine, finish it all even if you start to feel better.  Avoid smoking and secondhand smoke. Exposure to cigarette smoke or irritating chemicals will make bronchitis worse. If you are a smoker, consider using nicotine gum or skin patches to help control withdrawal symptoms. Quitting smoking will help your lungs heal faster.   Reduce the chances of another bout of acute bronchitis by washing your hands frequently, avoiding people with cold symptoms, and trying not to touch your hands to your mouth, nose, or eyes.   Keep all follow-up visits as directed by your health care provider.  SEEK MEDICAL CARE IF: Your symptoms do not improve after 1 week of treatment.  SEEK IMMEDIATE MEDICAL CARE IF:  You develop an increased fever or chills.   You have chest pain.     You have severe shortness of breath.  You have bloody sputum.   You develop dehydration.  You faint or repeatedly feel like you are going to pass out.  You develop repeated vomiting.  You develop a severe headache. MAKE SURE YOU:   Understand these instructions.  Will watch your  condition.  Will get help right away if you are not doing well or get worse.   This information is not intended to replace advice given to you by your health care provider. Make sure you discuss any questions you have with your health care provider.   Document Released: 03/15/2004 Document Revised: 02/26/2014 Document Reviewed: 07/29/2012 Elsevier Interactive Patient Education 2016 Elsevier Inc.  

## 2015-08-05 NOTE — Progress Notes (Signed)
Subjective:  By signing my name below, I, Rebecca Rasmussen, attest that this documentation has been prepared under the direction and in the presence of Rebecca Rasmussen , MD.  Electronically Signed: Andrew Rasmussen, ED Scribe. 08/05/2015. 8:24 AM.  Patient ID: Rebecca Rasmussen, female    DOB: 1945-05-17, 70 y.o.   MRN: [id number]  HPI   Chief Complaint  Patient presents with  . Sinusitis    X 3 DAYS  . Sore Throat    x 3 days  . Cough    x 3 days    HPI Comments: Rebecca Rasmussen is a 70 y.o. female who presents to the [facility] complaining of cough and sore throat. Pt states symptoms started 3 days ago with burning in her throat. Yesterday she developed cough, congestion and maxillary sinus tenderness. cough keeps her awake at night. She has taken OTC cold medication , mucinex and simple saline nasal spray. She takes Claritin regularly. She denies hx of asthma. Pt has never been a smoker. She denies fever and chills.   Patient Active Problem List   Diagnosis Date Noted  . Gastroesophageal reflux disease without esophagitis 11/18/2014  . Other chest pain 10/28/2013  . Pure hypercholesterolemia 10/28/2013  . Statin intolerance 10/28/2013  . Osteoporosis   . Menopause   . Atrophic vaginitis   . Anxiety   . Premature ovarian failure   . DDD (degenerative disc disease), lumbar 05/20/2012  . Osteopenia 07/20/2008   Past Medical History  Diagnosis Date  . Osteoporosis     osteopenia  . Menopause 2001    Took ERT 08/1999 - 2/ 2007  . Atrophic vaginitis   . Anxiety   . Osteopenia 07/2008  . DDD (degenerative disc disease), lumbar 05/2012    had a fall with sslipped disck 06/14/12  . Premature ovarian failure age 69   Past Surgical History  Procedure Laterality Date  . Abdominal hysterectomy  08/1999    secondary to fibroids  . Toe surgery Left about 2008    cyst removed under left great toe  . Eye surgery    . Appendectomy     Allergies  Allergen Reactions  . Zetia  [Ezetimibe] Rash  . Crestor [Rosuvastatin Calcium] Other (See Comments)    Severe muscle pain at higher dose  . Darvocet [Propoxyphene N-Acetaminophen] Other (See Comments)    Doesn't remember reaction  . Lipitor [Atorvastatin Calcium] Other (See Comments)    Severe muscle pain  . Prednisone Other (See Comments)    Weight gain  . Promethazine Other (See Comments)    Pt doesn't remember reaction  . Vicodin [Hydrocodone-Acetaminophen] Nausea Only  . Zocor [Simvastatin - High Dose] Other (See Comments)    Severe muscle pain  . Amoxicillin-Pot Clavulanate Rash  . Naprosyn [Naproxen] Rash  . Naproxen Sodium Rash   Prior to Admission medications   Medication Sig Start Date End Date Taking? Authorizing Provider  aspirin 81 MG tablet Take 81 mg by mouth 2 (two) times daily.   Yes Historical Provider, MD  Calcium Carbonate-Vitamin D (CALCIUM 600+D) 600-200 MG-UNIT TABS Take 1 tablet by mouth 2 (two) times daily.   Yes Historical Provider, MD  celecoxib (CELEBREX) 200 MG capsule Take 1 capsule (200 mg total) by mouth 2 (two) times daily. PRN Patient taking differently: Take 200 mg by mouth once. PRN 11/10/13  Yes Sherren Mocha, MD  cycloSPORINE (RESTASIS) 0.05 % ophthalmic emulsion Place 1 drop into both eyes 2 (two) times  daily.   Yes Historical Provider, MD  fluticasone (FLONASE) 50 MCG/ACT nasal spray Place 2 sprays into both nostrils daily. 04/22/15  Yes Ethelda Chick, MD  Glucosamine-Chondroit-Vit C-Mn (GLUCOSAMINE CHONDR 1500 COMPLX) CAPS Take by mouth daily.   Yes Historical Provider, MD  hydrochlorothiazide (HYDRODIURIL) 25 MG tablet Take 25 mg by mouth daily.   Yes Historical Provider, MD  Magnesium 400 MG CAPS Take by mouth daily.   Yes Historical Provider, MD  Multiple Vitamin (MULTIVITAMIN) tablet Take 1 tablet by mouth daily.   Yes Historical Provider, MD  Omega 3 1000 MG CAPS Take 1 capsule by mouth daily.   Yes Historical Provider, MD  rosuvastatin (CRESTOR) 5 MG tablet TAKE ONE TABLET  BY MOUTH ONCE DAILY OR AS DIRECTED 12/24/14  Yes Jake Bathe, MD  Saline (SIMPLY SALINE) 0.9 % AERS Place into the nose.   Yes Historical Provider, MD  vitamin B-12 (CYANOCOBALAMIN) 1000 MCG tablet Take 1,000 mcg by mouth daily.   Yes Historical Provider, MD  vitamin C (ASCORBIC ACID) 500 MG tablet Take 500 mg by mouth daily.   Yes Historical Provider, MD  Vitamin D, Ergocalciferol, (DRISDOL) 50000 units CAPS capsule Take 1 capsule (50,000 Units total) by mouth every 7 (seven) days. 07/15/15  Yes Ria Comment, FNP  vitamin E 100 UNIT capsule Take 400 Units by mouth daily.   Yes Historical Provider, MD  Loratadine (CLARITIN) 10 MG CAPS Take by mouth as needed. Reported on 08/05/2015    Historical Provider, MD   Review of Systems  Constitutional: Negative for fever and chills.  HENT: Positive for congestion, sinus pressure and sore throat.   Respiratory: Positive for cough.   Psychiatric/Behavioral: Positive for sleep disturbance.    Objective:   Physical Exam  Constitutional: She is oriented to person, place, and time. She appears well-developed and well-nourished. No distress.  HENT:  Head: Normocephalic and atraumatic.  Right Ear: Tympanic membrane is injected and retracted.  Left Ear: Tympanic membrane is retracted.  Nose: Nose normal.  Mouth/Throat: Uvula is midline, oropharynx is clear and moist and mucous membranes are normal.  Eyes: Conjunctivae and EOM are normal.  Neck: Neck supple. No thyromegaly present.  Cardiovascular: Regular rhythm, S1 normal and S2 normal.  Tachycardia present.   No murmur heard. Pulmonary/Chest: Effort normal. She has rales (bibasilar, inspiratory).  Decreased air movement throughout.  Musculoskeletal: Normal range of motion.  Lymphadenopathy:    She has no cervical adenopathy.  Neurological: She is alert and oriented to person, place, and time.  Skin: Skin is warm and dry.  Psychiatric: She has a normal mood and affect. Her behavior is normal.    Nursing note and vitals reviewed.  Filed Vitals:   08/05/15 0820  BP: 142/68  Pulse: 95  Temp: 99.8 F (37.7 C)  TempSrc: Oral  Resp: 16  Height: 5' 1.5" (1.562 m)  Weight: 128 lb 6.4 oz (58.242 kg)  SpO2: 95%    Dg Chest 2 View  08/05/2015  CLINICAL DATA:  Cough and wheezing for 4 days EXAM: CHEST  2 VIEW COMPARISON:  April 27, 2014 FINDINGS: There is no edema or consolidation. The heart size and pulmonary vascularity are normal. No adenopathy. There is atherosclerotic calcification in the aorta. No bone lesions. IMPRESSION: No edema or consolidation. Electronically Signed   By: Bretta Bang III M.D.   On: 08/05/2015 09:10    9:35 AM- on recheck after nebulizer treatment, improved air movement, lungs clear to auscultation   Assessment &  Plan:   1. Cough   2. Acute bronchitis, unspecified organism     Orders Placed This Encounter  Procedures  . DG Chest 2 View    Standing Status: Future     Number of Occurrences: 1     Standing Expiration Date: 08/04/2016    Order Specific Question:  Reason for Exam (SYMPTOM  OR DIAGNOSIS REQUIRED)    Answer:  bibasilar insp rales    Order Specific Question:  Preferred imaging location?    Answer:  External    Meds ordered this encounter  Medications  . albuterol (PROVENTIL) (2.5 MG/3ML) 0.083% nebulizer solution 2.5 mg    Sig:   . azithromycin (ZITHROMAX) 250 MG tablet    Sig: Take 2 tabs PO x 1 dose, then 1 tab PO QD x 4 days    Dispense:  6 tablet    Refill:  0  . benzonatate (TESSALON) 100 MG capsule    Sig: Take 1-2 capsules (100-200 mg total) by mouth 3 (three) times daily as needed for cough.    Dispense:  40 capsule    Refill:  0  . Albuterol Sulfate (PROAIR RESPICLICK) 108 (90 Base) MCG/ACT AEPB    Sig: Inhale 2 puffs into the lungs every 4 (four) hours as needed.    Dispense:  1 each    Refill:  0    Encouraged to continue nasal saline and mucinex and restart flonase. Use albuterol scheduled qid.    I  personally performed the services described in this documentation, which was scribed in my presence. The recorded information has been reviewed and considered, and addended by me as needed.   Rebecca SorensonEva Shaw, M.D.  [facility]  [place] [address] [place], [place] [address] [phone] phone [phone] fax  08/05/2015 9:50 AM

## 2015-10-18 ENCOUNTER — Telehealth: Payer: Self-pay | Admitting: *Deleted

## 2015-10-18 NOTE — Telephone Encounter (Signed)
Spoke with Burman Nieveseborah Carey with work for [employer] clarifying Rxs for pts.  She is concerned because the patients Rx for Crestor rosuvastatin states to be taken daily or as directed.  Pt's Rx was written for a supply to take daily though at this time she is taking it twice a week (or as directed).  Explained to Gavin PoundDeborah the Rx was written this way because the intention was for pt to be able to increase the medication to be taken daily however this has not occurred at this point.   Also advised the pt has an upcoming appt and it will be reviewed then.  A new Rx can be sent into her pharmacy once she has been seen.

## 2015-10-18 NOTE — Telephone Encounter (Signed)
Received a call from Burman Nieveseborah Carey who stated that she works with this mutual patient of Dr Anne FuSkains. She stated that the sig on the rx for rosuvastatin needs to be clarified and corrected. She stated that she has a list of rx discrepancies and she came across this patient and was following up. She stated that she called the patient to verify and she only takes the rosuvastatin twice a week. This rx was sent in last year so not sure why it is just now being questioned. Gavin PoundDeborah can be reached at [phone]. Please advise. Thanks, MI

## 2015-11-18 ENCOUNTER — Ambulatory Visit (INDEPENDENT_AMBULATORY_CARE_PROVIDER_SITE_OTHER): Payer: Medicare Other | Admitting: Cardiology

## 2015-11-18 ENCOUNTER — Encounter (INDEPENDENT_AMBULATORY_CARE_PROVIDER_SITE_OTHER): Payer: Self-pay

## 2015-11-18 ENCOUNTER — Encounter: Payer: Self-pay | Admitting: Cardiology

## 2015-11-18 VITALS — BP 126/72 | HR 76 | Ht 62.0 in | Wt 127.0 lb

## 2015-11-18 DIAGNOSIS — E785 Hyperlipidemia, unspecified: Secondary | ICD-10-CM

## 2015-11-18 DIAGNOSIS — Z889 Allergy status to unspecified drugs, medicaments and biological substances status: Secondary | ICD-10-CM

## 2015-11-18 DIAGNOSIS — Z789 Other specified health status: Secondary | ICD-10-CM

## 2015-11-18 DIAGNOSIS — Z79899 Other long term (current) drug therapy: Secondary | ICD-10-CM

## 2015-11-18 NOTE — Progress Notes (Signed)
[address], [address] [place], [place]  [address] Phone: [phone] Fax:  [phone]  Date:  11/18/2015   ID:  [name], [name] 10-Oct-1945, MRN [id number]  PCP:  Hollice Espy, MD   History of Present Illness: Rebecca Rasmussen is a 70 y.o. female with hyperlipidemia, difficult to control with prior myalgias.  On 02/27/13-LDL 142, triglycerides 97, ALT 28. Prior lipid panel showed an LDL of 163, triglycerides of 190. She has been tolerating Crestor 2 times a week. No myalgias. Doing well.Prior RASH on Zetia.  Hot flashes are less apparent currently, diminished headaches, post menopausal symptoms.  Lipid clinic notes reviewed. Lab work reviewed. No symptoms on twice weekly Crestor.   Wt Readings from Last 3 Encounters:  11/18/15 127 lb (57.6 kg)  08/05/15 128 lb 6.4 oz (58.2 kg)  07/15/15 128 lb (58.1 kg)     Past Medical History:  Diagnosis Date  . Anxiety   . Atrophic vaginitis   . DDD (degenerative disc disease), lumbar 05/2012   had a fall with sslipped disck 06/14/12  . Menopause 2001   Took ERT 08/1999 - 2/ 2007  . Osteopenia 07/2008  . Osteoporosis    osteopenia  . Premature ovarian failure age 45    Past Surgical History:  Procedure Laterality Date  . ABDOMINAL HYSTERECTOMY  08/1999   secondary to fibroids  . APPENDECTOMY    . EYE SURGERY    . TOE SURGERY Left about 2008   cyst removed under left great toe    Current Outpatient Prescriptions  Medication Sig Dispense Refill  . Albuterol Sulfate (PROAIR RESPICLICK) 108 (90 Base) MCG/ACT AEPB Inhale 2 puffs into the lungs every 4 (four) hours as needed. 1 each 0  . aspirin 81 MG tablet Take 81 mg by mouth 2 (two) times daily.    . Calcium Carbonate-Vitamin D (CALCIUM 600+D) 600-200 MG-UNIT TABS Take 1 tablet by mouth 2 (two) times daily.    . cyclobenzaprine (FLEXERIL) 10 MG tablet Take 10 mg by mouth at bedtime.    . cycloSPORINE (RESTASIS) 0.05 % ophthalmic emulsion Place 1 drop into both eyes  2 (two) times daily.    Marland Kitchen etodolac (LODINE) 400 MG tablet Take 400 mg by mouth 2 (two) times daily.    . fluticasone (FLONASE) 50 MCG/ACT nasal spray Place 2 sprays into both nostrils daily. 16 g 6  . Glucosamine-Chondroit-Vit C-Mn (GLUCOSAMINE CHONDR 1500 COMPLX) CAPS Take by mouth daily.    . hydrochlorothiazide (HYDRODIURIL) 25 MG tablet Take 25 mg by mouth daily.    . Loratadine (CLARITIN) 10 MG CAPS Take by mouth as needed. Reported on 08/05/2015    . Magnesium 400 MG CAPS Take by mouth daily.    . Multiple Vitamin (MULTIVITAMIN) tablet Take 1 tablet by mouth daily.    . Omega 3 1000 MG CAPS Take 1 capsule by mouth daily.    . rosuvastatin (CRESTOR) 5 MG tablet Patient takes 5 mg on Wed and Sun by mouth of each week at lowest dose.    . Saline (SIMPLY SALINE) 0.9 % AERS Place into the nose.    . vitamin B-12 (CYANOCOBALAMIN) 1000 MCG tablet Take 1,000 mcg by mouth daily.    . vitamin C (ASCORBIC ACID) 500 MG tablet Take 500 mg by mouth daily.    . Vitamin D, Ergocalciferol, (DRISDOL) 50000 units CAPS capsule Take 1 capsule (50,000 Units total) by mouth every 7 (seven) days. 30 capsule 3  .  vitamin E 100 UNIT capsule Take 400 Units by mouth daily.     No current facility-administered medications for this visit.     Allergies:    Allergies  Allergen Reactions  . Zetia [Ezetimibe] Rash  . Crestor [Rosuvastatin Calcium] Other (See Comments)    Severe muscle pain at higher dose  . Darvocet [Propoxyphene N-Acetaminophen] Other (See Comments)    Doesn't remember reaction  . Lipitor [Atorvastatin Calcium] Other (See Comments)    Severe muscle pain  . Prednisone Other (See Comments)    Weight gain  . Promethazine Other (See Comments)    Pt doesn't remember reaction  . Vicodin [Hydrocodone-Acetaminophen] Nausea Only  . Zocor [Simvastatin - High Dose] Other (See Comments)    Severe muscle pain  . Amoxicillin-Pot Clavulanate Rash  . Naprosyn [Naproxen] Rash  . Naproxen Sodium Rash     Social History:  The patient  reports that she has never smoked. She has never used smokeless tobacco. She reports that she does not drink alcohol or use drugs.   Family History  Problem Relation Age of Onset  . Cancer - Lung Mother 3979  . Cirrhosis Father [id number]  . Heart failure Maternal Grandmother   . Cancer Paternal Grandmother   . Cancer Paternal Grandfather     ROS:  Please see the history of present illness.   Denies any fevers, chills, orthopnea, PND   All other systems reviewed and negative.   PHYSICAL EXAM: VS:  BP 126/72   Pulse 76   Ht 5\' 2"  (1.575 m)   Wt 127 lb (57.6 kg)   LMP 08/22/1999 (Exact Date)   BMI 23.23 kg/m  Well nourished, well developed, in no acute distress  HEENT: normal, [place]/AT, EOMI Neck: no JVD, normal carotid upstroke, no bruit Cardiac:  normal S1, S2; RRR; no murmur  Lungs:  clear to auscultation bilaterally, no wheezing, rhonchi or rales  Abd: soft, nontender, no hepatomegaly, no bruits  Ext: no edema, 2+ distal pulses Skin: warm and dry  GU: deferred Neuro: no focal abnormalities noted, AAO x 3  EKG:  EKG ordered today 11/18/15-sinus rhythm, 76, nonspecific ST-T wave scooping. Personally viewed-prior 10/28/13-sinus rhythm, 82, peak T waves in lead to, right atrial enlargement, no ST segment changes.     NUC 11/10/13 - low risk.  10/27/14-LDL 177 on once a week Crestor  Lipid Panel     Component Value Date/Time   CHOL 236 (H) 02/07/2015 0829   TRIG 115 02/07/2015 0829   HDL 45 (L) 02/07/2015 0829   CHOLHDL 5.2 (H) 02/07/2015 0829   VLDL 23 02/07/2015 0829   LDLCALC 168 (H) 02/07/2015 0829    ASSESSMENT AND PLAN:   1. Hyperlipidemia-LDL from 177 down to 168 on twice-weekly Crestor. It has been much higher in the past. She has been only able to tolerate Crestor at low doses, she is doing well with 2 days a week. She realizes that she can differentiate the difference between leg pain/back pain and possible myalgias from statins. She is  doing well with this. No longer on WelChol. 2. Statin intolerance-she's had issues with myalgias in the past with higher doses of statin, more frequent doses. Doing well on current 2 days a week Crestor. Appreciate the assistance previously of Audrie LiaSally Earl, [place]. and the lipid clinic. We will check lipid panel and ALT.  3. One-year followup.  Signed, Donato SchultzMark Allean Montfort, MD [facility]  11/18/2015 11:39 AM

## 2015-11-18 NOTE — Patient Instructions (Signed)
Medication Instructions:  The current medical regimen is effective;  continue present plan and medications.  Labwork: Please return next week for fasting labs (Lipid/Alt)  Follow-Up: Follow up in 1 year with Dr. Anne FuSkains.  You will receive a letter in the mail 2 months before you are due.  Please call us when you receive this letter to schedule your follow up appointment.  If you need a refill on your cardiac medications before your next appointment, please call your pharmacy.  Thank you for choosing [facility]!!

## 2015-11-23 ENCOUNTER — Other Ambulatory Visit: Payer: Medicare Other | Admitting: *Deleted

## 2015-11-23 DIAGNOSIS — E785 Hyperlipidemia, unspecified: Secondary | ICD-10-CM

## 2015-11-23 DIAGNOSIS — Z79899 Other long term (current) drug therapy: Secondary | ICD-10-CM

## 2015-11-23 DIAGNOSIS — Z789 Other specified health status: Secondary | ICD-10-CM

## 2015-11-23 LAB — LIPID PANEL
CHOL/HDL RATIO: 6.2 ratio — AB (ref <=5.0)
CHOLESTEROL: 259 mg/dL — AB (ref 125–200)
HDL: 42 mg/dL — ABNORMAL LOW (ref >=46)
LDL Cholesterol: 187 mg/dL — ABNORMAL HIGH (ref <130)
TRIGLYCERIDES: 149 mg/dL (ref <150)
VLDL: 30 mg/dL (ref <30)

## 2015-11-23 LAB — ALT: ALT: 17 U/L (ref 6–29)

## 2015-12-12 ENCOUNTER — Other Ambulatory Visit: Payer: Self-pay | Admitting: Family Medicine

## 2015-12-12 DIAGNOSIS — Z1231 Encounter for screening mammogram for malignant neoplasm of breast: Secondary | ICD-10-CM

## 2016-01-16 ENCOUNTER — Ambulatory Visit
Admission: RE | Admit: 2016-01-16 | Discharge: 2016-01-16 | Disposition: A | Discharge status: Home / Self Care | Payer: Medicare Other | Source: Ambulatory Visit | Attending: Family Medicine | Admitting: Family Medicine

## 2016-01-16 DIAGNOSIS — Z1231 Encounter for screening mammogram for malignant neoplasm of breast: Secondary | ICD-10-CM

## 2016-01-26 ENCOUNTER — Other Ambulatory Visit: Payer: Self-pay | Admitting: Cardiology

## 2016-02-17 ENCOUNTER — Ambulatory Visit (INDEPENDENT_AMBULATORY_CARE_PROVIDER_SITE_OTHER): Payer: Medicare Other | Admitting: Physician Assistant

## 2016-02-17 VITALS — BP 124/70 | HR 80 | Temp 98.4°F | Resp 18 | Ht 62.0 in | Wt 130.0 lb

## 2016-02-17 DIAGNOSIS — H6121 Impacted cerumen, right ear: Secondary | ICD-10-CM | POA: Diagnosis not present

## 2016-02-17 DIAGNOSIS — R21 Rash and other nonspecific skin eruption: Secondary | ICD-10-CM | POA: Diagnosis not present

## 2016-02-17 DIAGNOSIS — J0100 Acute maxillary sinusitis, unspecified: Secondary | ICD-10-CM | POA: Diagnosis not present

## 2016-02-17 MED ORDER — VALACYCLOVIR HCL 1 G PO TABS: 1000.0000 mg | ORAL_TABLET | Freq: Three times a day (TID) | ORAL | 0 refills | Status: AC

## 2016-02-17 MED ORDER — DOXYCYCLINE HYCLATE 100 MG PO CAPS: 100.0000 mg | ORAL_CAPSULE | Freq: Two times a day (BID) | ORAL | 0 refills | Status: AC

## 2016-02-17 NOTE — Patient Instructions (Addendum)
Avoid flonase while it is irritating your nose. Take antibiotics as prescribed. Follow up if no improvement with treatment. You will also need to take valtrex for shingles.  Sinusitis, Adult Sinusitis is soreness and inflammation of your sinuses. Sinuses are hollow spaces in the bones around your face. They are located:  Around your eyes.  In the middle of your forehead.  Behind your nose.  In your cheekbones. Your sinuses and nasal passages are lined with a stringy fluid (mucus). Mucus normally drains out of your sinuses. When your nasal tissues get inflamed or swollen, the mucus can get trapped or blocked so air cannot flow through your sinuses. This lets bacteria, viruses, and funguses grow, and that leads to infection. Follow these instructions at home: Medicines  Take, use, or apply over-the-counter and prescription medicines only as told by your doctor. These may include nasal sprays.  If you were prescribed an antibiotic medicine, take it as told by your doctor. Do not stop taking the antibiotic even if you start to feel better. Hydrate and Humidify  Drink enough water to keep your pee (urine) clear or pale yellow.  Use a cool mist humidifier to keep the humidity level in your home above 50%.  Breathe in steam for 10-15 minutes, 3-4 times a day or as told by your doctor. You can do this in the bathroom while a hot shower is running.  Try not to spend time in cool or dry air. Rest  Rest as much as possible.  Sleep with your head raised (elevated).  Make sure to get enough sleep each night. General instructions  Put a warm, moist washcloth on your face 3-4 times a day or as told by your doctor. This will help with discomfort.  Wash your hands often with soap and water. If there is no soap and water, use hand sanitizer.  Do not smoke. Avoid being around people who are smoking (secondhand smoke).  Keep all follow-up visits as told by your doctor. This is  important. Contact a doctor if:  You have a fever.  Your symptoms get worse.  Your symptoms do not get better within 10 days. Get help right away if:  You have a very bad headache.  You cannot stop throwing up (vomiting).  You have pain or swelling around your face or eyes.  You have trouble seeing.  You feel confused.  Your neck is stiff.  You have trouble breathing. This information is not intended to replace advice given to you by your health care provider. Make sure you discuss any questions you have with your health care provider. Document Released: 07/25/2007 Document Revised: 10/02/2015 Document Reviewed: 12/01/2014 Elsevier Interactive Patient Education  2017 Elsevier Inc.    Shingles Shingles is an infection that causes a painful skin rash and fluid-filled blisters. Shingles is caused by the same virus that causes chickenpox. Shingles only develops in people who:  Have had chickenpox.  Have gotten the chickenpox vaccine. (This is rare.) The first symptoms of shingles may be itching, tingling, or pain in an area on your skin. A rash will follow in a few days or weeks. The rash is usually on one side of the body in a bandlike or beltlike pattern. Over time, the rash turns into fluid-filled blisters that break open, scab over, and dry up. Medicines may:  Help you manage pain.  Help you recover more quickly.  Help to prevent long-term problems. Follow these instructions at home: Medicines  Take medicines only as  told by your doctor.  Apply an anti-itch or numbing cream to the affected area as told by your doctor. Blister and Rash Care  Take a cool bath or put cool compresses on the area of the rash or blisters as told by your doctor. This may help with pain and itching.  Keep your rash covered with a loose bandage (dressing). Wear loose-fitting clothing.  Keep your rash and blisters clean with mild soap and cool water or as told by your doctor.  Check  your rash every day for signs of infection. These include redness, swelling, and pain that lasts or gets worse.  Do not pick your blisters.  Do not scratch your rash. General instructions  Rest as told by your doctor.  Keep all follow-up visits as told by your doctor. This is important.  Until your blisters scab over, your infection can cause chickenpox in people who have never had it or been vaccinated against it. To prevent this from happening, avoid touching other people or being around other people, especially:  Babies.  Pregnant women.  Children who have eczema.  Elderly people who have transplants.  People who have chronic illnesses, such as leukemia or AIDS. Contact a doctor if:  Your pain does not get better with medicine.  Your pain does not get better after the rash heals.  Your rash looks infected. Signs of infection include:  Redness.  Swelling.  Pain that lasts or gets worse. Get help right away if:  The rash is on your face or nose.  You have pain in your face, pain around your eye area, or loss of feeling on one side of your face.  You have ear pain or you have ringing in your ear.  You have loss of taste.  Your condition gets worse. This information is not intended to replace advice given to you by your health care provider. Make sure you discuss any questions you have with your health care provider. Document Released: 07/25/2007 Document Revised: 10/02/2015 Document Reviewed: 11/17/2013 Elsevier Interactive Patient Education  2017 [employer]   IF you received an x-ray today, you will receive an invoice from [facility]. Please contact [facility] at [phone] with questions or concerns regarding your invoice.   IF you received labwork today, you will receive an invoice from [facility]. Please contact [facility] at [phone] with questions or concerns regarding your invoice.   Our billing staff will not be able to assist  you with questions regarding bills from these companies.  You will be contacted with the lab results as soon as they are available. The fastest way to get your results is to activate your My Chart account. Instructions are located on the last page of this paperwork. If you have not heard from us regarding the results in 2 weeks, please contact this office.

## 2016-02-17 NOTE — Progress Notes (Signed)
MRN: [id number] DOB: [birth year]  Subjective:   Rebecca Rasmussen is a 70 y.o. female presenting for chief complaint of Sinusitis; Hand Pain (right); and Cough .  Reports 7 day history of sinus congestion, sinus pain, ear fullness and dry cough (no hemoptysis). Has tried claritin, flonase, and mucinex every day with temporary relief but no full time relief. Denies fever, wheezing, shortness of breath, chest tightness, chest pain and myalgia, subjective fever, night sweats, chills, fatigue, malaise, decreased appetite, nausea, vomiting, abdominal pain and diarrhea. Has not had sick contact with anyone. Has extensive history of seasonal allergies year round. No history of asthma. Patient has had flu shot this season. Denies smoking. Has history of sinus infections yearly.   Also has rash on right hand x 1 day. Has associated itching and burning. Denies purulent drainage, change in laundry detergent, hand lotions, or new chemical or medication exposure. Has tried neosporin with mild relief. Has had shingles vaccine.   Rebecca Rasmussen has a current medication list which includes the following prescription(s): albuterol sulfate, aspirin, calcium carbonate-vitamin d, cyclobenzaprine, cyclosporine, etodolac, fluticasone, glucosamine chondr 1500 complx, hydrochlorothiazide, loratadine, magnesium, multivitamin, omega 3, rosuvastatin, rosuvastatin, saline, vitamin b-12, vitamin c, vitamin d (ergocalciferol), and vitamin e. Also is allergic to zetia [ezetimibe]; crestor [rosuvastatin calcium]; darvocet [propoxyphene n-acetaminophen]; lipitor [atorvastatin calcium]; prednisone; promethazine; vicodin [hydrocodone-acetaminophen]; zocor [simvastatin - high dose]; amoxicillin-pot clavulanate; naprosyn [naproxen]; and naproxen sodium.  Rebecca Rasmussen  has a past medical history of Anxiety; Atrophic vaginitis; DDD (degenerative disc disease), lumbar (05/2012); Menopause (2001); Osteopenia (07/2008); Osteoporosis; and Premature ovarian failure  (age 70). Also  has a past surgical history that includes Abdominal hysterectomy (08/1999); Toe Surgery (Left, about 2008); Eye surgery; and Appendectomy.   Objective:   Vitals: BP 124/70 (BP Location: Right Arm, Patient Position: Sitting, Cuff Size: Small)   Pulse 80   Temp 98.4 F (36.9 C) (Oral)   Resp 18   Ht 5\' 2"  (1.575 m)   Wt 130 lb (59 kg)   LMP 08/22/1999 (Exact Date)   SpO2 97%   BMI 23.78 kg/m   Physical Exam  Constitutional: She is oriented to person, place, and time. She appears well-developed and well-nourished.  HENT:  Head: Normocephalic and atraumatic.  Right Ear: External ear and ear canal normal. There is drainage (cerumen blocking view of TM).  Left Ear: Tympanic membrane, external ear and ear canal normal.  Nose: Mucosal edema present. No epistaxis. Right sinus exhibits maxillary sinus tenderness. Right sinus exhibits no frontal sinus tenderness. Left sinus exhibits maxillary sinus tenderness. Left sinus exhibits no frontal sinus tenderness.  Mouth/Throat: Uvula is midline, oropharynx is clear and moist and mucous membranes are normal.  Eyes: Conjunctivae are normal.  Neck: Normal range of motion.  Cardiovascular: Normal rate, regular rhythm and normal heart sounds.   Pulmonary/Chest: Effort normal and breath sounds normal.  Lymphadenopathy:       Head (right side): No submental, no submandibular, no tonsillar, no preauricular, no posterior auricular and no occipital adenopathy present.       Head (left side): No submental, no submandibular, no tonsillar, no preauricular, no posterior auricular and no occipital adenopathy present.    She has no cervical adenopathy.       Right: No supraclavicular adenopathy present.       Left: No supraclavicular adenopathy present.  Neurological: She is alert and oriented to person, place, and time.  Skin: Skin is warm and dry.  Erythematous vesicular papules noted in the radial distribution of volar  aspect of right hand.     Psychiatric: She has a normal mood and affect.  Vitals reviewed.   No results found for this or any previous visit (from the past 24 hour(s)).  Assessment and Plan :  1. Impacted cerumen of right ear Improved - Ear wax removal  2. Acute maxillary sinusitis, recurrence not specified Will cover for bacterial etiology as patient's has chronic difficult to control allergies and symptoms are not improving but getting worse over the past 7 days. - doxycycline (VIBRAMYCIN) 100 MG capsule; Take 1 capsule (100 mg total) by mouth 2 (two) times daily.  Dispense: 14 capsule; Refill: 0  3. Rash and nonspecific skin eruption Consistent with shingles. Will treat accordingly. Pt to follow up in one week if no improvement. Follow up sooner if symptoms worsen.  - valACYclovir (VALTREX) 1000 MG tablet; Take 1 tablet (1,000 mg total) by mouth 3 (three) times daily.  Dispense: 21 tablet; Refill: 0   Benjiman CoreBrittany Wiseman, PA-C  [facility] 02/17/2016 1:12 PM

## 2016-05-16 DIAGNOSIS — R200 Anesthesia of skin: Secondary | ICD-10-CM | POA: Insufficient documentation

## 2016-05-16 DIAGNOSIS — M180 Bilateral primary osteoarthritis of first carpometacarpal joints: Secondary | ICD-10-CM | POA: Insufficient documentation

## 2016-07-11 DIAGNOSIS — G5601 Carpal tunnel syndrome, right upper limb: Secondary | ICD-10-CM | POA: Insufficient documentation

## 2016-07-18 ENCOUNTER — Ambulatory Visit (INDEPENDENT_AMBULATORY_CARE_PROVIDER_SITE_OTHER): Payer: Medicare Other | Admitting: Nurse Practitioner

## 2016-07-18 ENCOUNTER — Encounter: Payer: Self-pay | Admitting: Nurse Practitioner

## 2016-07-18 VITALS — BP 140/70 | HR 64 | Resp 16 | Ht 61.25 in | Wt 125.0 lb

## 2016-07-18 DIAGNOSIS — E559 Vitamin D deficiency, unspecified: Secondary | ICD-10-CM

## 2016-07-18 DIAGNOSIS — Z23 Encounter for immunization: Secondary | ICD-10-CM

## 2016-07-18 DIAGNOSIS — Z01419 Encounter for gynecological examination (general) (routine) without abnormal findings: Secondary | ICD-10-CM | POA: Diagnosis not present

## 2016-07-18 MED ORDER — VITAMIN D (ERGOCALCIFEROL) 1.25 MG (50000 UNIT) PO CAPS: 50000.0000 [IU] | ORAL_CAPSULE | ORAL | 3 refills | Status: DC

## 2016-07-18 NOTE — Patient Instructions (Signed)

## 2016-07-18 NOTE — Progress Notes (Deleted)
71 y.o. G0P0000 Widowed  African American Fe here for annual exam.    Patient's last menstrual period was 08/22/1999 (exact date).          Sexually active: No.  The current method of family planning is status post hysterectomy.    Exercising: Yes.    Walking Smoker:  no  Health Maintenance: Pap:  05/07/2001 wnl History of Abnormal Pap: no MMG:  01/16/16 [facility] Self Breast exams: Sometimes Colonoscopy: 06/03/14 Polyps BMD:  06/29/13 T-Score: -0.4; L Neck: -0.8; Normal TDaP:  Unsure Shingles: 07/01/14 Pneumonia: 2016 Hep C and HIV: 07/15/15 Neg Labs: PCP   reports that she has never smoked. She has never used smokeless tobacco. She reports that she does not drink alcohol or use drugs.  Past Medical History:  Diagnosis Date  . Anxiety   . Atrophic vaginitis   . DDD (degenerative disc disease), lumbar 05/2012   had a fall with sslipped disck 06/14/12  . Menopause 2001   Took ERT 08/1999 - 2/ 2007  . Osteopenia 07/2008  . Osteoporosis    osteopenia  . Premature ovarian failure age 21    Past Surgical History:  Procedure Laterality Date  . ABDOMINAL HYSTERECTOMY  08/1999   secondary to fibroids  . APPENDECTOMY    . EYE SURGERY    . TOE SURGERY Left about 2008   cyst removed under left great toe    Current Outpatient Prescriptions  Medication Sig Dispense Refill  . Albuterol Sulfate (PROAIR RESPICLICK) 108 (90 Base) MCG/ACT AEPB Inhale 2 puffs into the lungs every 4 (four) hours as needed. 1 each 0  . aspirin 81 MG tablet Take 81 mg by mouth 2 (two) times daily.    . Calcium Carbonate-Vitamin D (CALCIUM 600+D) 600-200 MG-UNIT TABS Take 1 tablet by mouth 2 (two) times daily.    . cyclobenzaprine (FLEXERIL) 10 MG tablet Take 10 mg by mouth at bedtime.    . cycloSPORINE (RESTASIS) 0.05 % ophthalmic emulsion Place 1 drop into both eyes 2 (two) times daily.    Marland Kitchen etodolac (LODINE) 400 MG tablet Take 400 mg by mouth 2 (two) times daily.    .  Glucosamine-Chondroit-Vit C-Mn (GLUCOSAMINE CHONDR 1500 COMPLX) CAPS Take by mouth daily.    . hydrochlorothiazide (HYDRODIURIL) 25 MG tablet Take 25 mg by mouth daily.    . Loratadine (CLARITIN) 10 MG CAPS Take by mouth as needed. Reported on 08/05/2015    . Magnesium 400 MG CAPS Take by mouth daily.    . Multiple Vitamin (MULTIVITAMIN) tablet Take 1 tablet by mouth daily.    . Omega 3 1000 MG CAPS Take 1 capsule by mouth daily.    . rosuvastatin (CRESTOR) 5 MG tablet Patient takes 5 mg on Wed and Sun by mouth of each week at lowest dose.    . Saline (SIMPLY SALINE) 0.9 % AERS Place into the nose.    . vitamin B-12 (CYANOCOBALAMIN) 1000 MCG tablet Take 1,000 mcg by mouth daily.    . vitamin C (ASCORBIC ACID) 500 MG tablet Take 500 mg by mouth daily.    . Vitamin D, Ergocalciferol, (DRISDOL) 50000 units CAPS capsule Take 1 capsule (50,000 Units total) by mouth every 7 (seven) days. 30 capsule 3  . vitamin E 100 UNIT capsule Take 400 Units by mouth daily.     No current facility-administered medications for this visit.     Family History  Problem Relation Age of Onset  . Cancer - Lung  Mother 7279  . Cirrhosis Father 5360  . Heart failure Maternal Grandmother   . Cancer Paternal Grandmother   . Cancer Paternal Grandfather     ROS:  Pertinent items are noted in HPI.  Otherwise, a comprehensive ROS was negative.  Exam:   BP 140/70 (BP Location: Right Arm, Patient Position: Sitting, Cuff Size: Normal)   Pulse 64   Resp 16   Ht 5' 1.25" (1.556 m)   Wt 125 lb (56.7 kg)   LMP 08/22/1999 (Exact Date)   BMI 23.43 kg/m  Height: 5' 1.25" (155.6 cm) Ht Readings from Last 3 Encounters:  07/18/16 5' 1.25" (1.556 m)  02/17/16 5\' 2"  (1.575 m)  11/18/15 5\' 2"  (1.575 m)    General appearance: alert, cooperative and appears stated age Head: Normocephalic, without obvious abnormality, atraumatic Neck: no adenopathy, supple, symmetrical, trachea midline and thyroid {EXAM; THYROID:18604} Lungs:  clear to auscultation bilaterally Breasts: {Exam; breast:13139::"normal appearance, no masses or tenderness"} Heart: regular rate and rhythm Abdomen: soft, non-tender; no masses,  no organomegaly Extremities: extremities normal, atraumatic, no cyanosis or edema Skin: Skin color, texture, turgor normal. No rashes or lesions Lymph nodes: Cervical, supraclavicular, and axillary nodes normal. No abnormal inguinal nodes palpated Neurologic: Grossly normal   Pelvic: External genitalia:  no lesions              Urethra:  normal appearing urethra with no masses, tenderness or lesions              Bartholin's and Skene's: normal                 Vagina: normal appearing vagina with normal color and discharge, no lesions              Cervix: {exam; cervix:14595}              Pap taken: {yes no:314532} Bimanual Exam:  Uterus:  {exam; uterus:12215}              Adnexa: {exam; adnexa:12223}               Rectovaginal: Confirms               Anus:  normal sphincter tone, no lesions  Chaperone present: ***  A:  Well Woman with normal exam    S/P TAH /BSO 08/22/1999 secondary to fibroids 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

## 2016-07-18 NOTE — Progress Notes (Signed)
Patient ID: Rebecca Rasmussen, female   DOB: 1945/09/27, 71 y.o.   MRN: [id number]  71 y.o. G0P0000 Widowed  African American Fe here for annual exam. No new health problems.   She has now moved to [facility] into an apt.  Her niece and other family members live near by.  She likes not having to care for their large home and outdoor work since her husband passed 02/2015. She is getting out and doing more so her grief is less.   Patient's last menstrual period was 08/22/1999 (exact date).          Sexually active: No.  The current method of family planning is abstinence and post menopausal status.    Exercising: No.  The patient does not participate in regular exercise at present. Smoker:  no  Health Maintenance: Pap: 05/07/01, Negative (hysterectomy due to fibroids) History of Abnormal Pap: no MMG: 01/16/16, 3D-yes, Density Category D, Bi-Rads 1:  Negative Self Breast exams: Sometimes Colonoscopy: 06/03/14, Tubular Adenoma x 2, repeat in 3 years BMD: 06/29/13 T Score: -0.4 Spine / -0.8 Left Femur Neck TDaP: Unsure Shingles: 07/15/15 Pneumonia: 2016 Hep C: 07/15/15 Neg Labs: PCP takes care of screening labs, we follow Vit D   reports that she has never smoked. She has never used smokeless tobacco. She reports that she does not drink alcohol or use drugs.  Past Medical History:  Diagnosis Date  . Anxiety   . Atrophic vaginitis   . DDD (degenerative disc disease), lumbar 05/2012   had a fall with sslipped disck 06/14/12  . Menopause 2001   Took ERT 08/1999 - 2/ 2007  . Osteopenia 07/2008  . Osteoporosis    osteopenia  . Premature ovarian failure age 71    Past Surgical History:  Procedure Laterality Date  . ABDOMINAL HYSTERECTOMY  08/1999   secondary to fibroids  . APPENDECTOMY    . EYE SURGERY    . TOE SURGERY Left about 2008   cyst removed under left great toe    Current Outpatient Prescriptions  Medication Sig Dispense Refill  . Albuterol Sulfate (PROAIR RESPICLICK) 108 (90  Base) MCG/ACT AEPB Inhale 2 puffs into the lungs every 4 (four) hours as needed. 1 each 0  . aspirin 81 MG tablet Take 81 mg by mouth 2 (two) times daily.    . Calcium Carbonate-Vitamin D (CALCIUM 600+D) 600-200 MG-UNIT TABS Take 1 tablet by mouth 2 (two) times daily.    . cyclobenzaprine (FLEXERIL) 10 MG tablet Take 10 mg by mouth at bedtime.    . cycloSPORINE (RESTASIS) 0.05 % ophthalmic emulsion Place 1 drop into both eyes 2 (two) times daily.    Marland Kitchen etodolac (LODINE) 400 MG tablet Take 400 mg by mouth 2 (two) times daily.    . fluticasone (FLONASE) 50 MCG/ACT nasal spray Place 2 sprays into both nostrils daily. 16 g 6  . Glucosamine-Chondroit-Vit C-Mn (GLUCOSAMINE CHONDR 1500 COMPLX) CAPS Take by mouth daily.    . hydrochlorothiazide (HYDRODIURIL) 25 MG tablet Take 25 mg by mouth daily.    . Loratadine (CLARITIN) 10 MG CAPS Take by mouth as needed. Reported on 08/05/2015    . Magnesium 400 MG CAPS Take by mouth daily.    . Multiple Vitamin (MULTIVITAMIN) tablet Take 1 tablet by mouth daily.    . Omega 3 1000 MG CAPS Take 1 capsule by mouth daily.    . rosuvastatin (CRESTOR) 5 MG tablet Patient takes 5 mg on Wed and Sun by mouth of  each week at lowest dose.    . rosuvastatin (CRESTOR) 5 MG tablet TAKE ONE TABLET BY MOUTH ONCE DAILY OR AS DIRECTED 30 tablet 8  . Saline (SIMPLY SALINE) 0.9 % AERS Place into the nose.    . vitamin B-12 (CYANOCOBALAMIN) 1000 MCG tablet Take 1,000 mcg by mouth daily.    . vitamin C (ASCORBIC ACID) 500 MG tablet Take 500 mg by mouth daily.    . Vitamin D, Ergocalciferol, (DRISDOL) 50000 units CAPS capsule Take 1 capsule (50,000 Units total) by mouth every 7 (seven) days. 30 capsule 3  . vitamin E 100 UNIT capsule Take 400 Units by mouth daily.     No current facility-administered medications for this visit.     Family History  Problem Relation Age of Onset  . Cancer - Lung Mother 5479  . Cirrhosis Father 6560  . Heart failure Maternal Grandmother   . Cancer  Paternal Grandmother   . Cancer Paternal Grandfather     ROS:  Pertinent items are noted in HPI.  Otherwise, a comprehensive ROS was negative.  Exam:   LMP 08/22/1999 (Exact Date)    Ht Readings from Last 3 Encounters:  02/17/16 5\' 2"  (1.575 m)  11/18/15 5\' 2"  (1.575 m)  08/05/15 5' 1.5" (1.562 m)    General appearance: alert, cooperative and appears stated age Head: Normocephalic, without obvious abnormality, atraumatic Neck: no adenopathy, supple, symmetrical, trachea midline and thyroid normal to inspection and palpation Lungs: clear to auscultation bilaterally Breasts: normal appearance, no masses or tenderness Heart: regular rate and rhythm Abdomen: soft, non-tender; no masses,  no organomegaly Extremities: extremities normal, atraumatic, no cyanosis or edema Skin: Skin color, texture, turgor normal. No rashes or lesions Lymph nodes: Cervical, supraclavicular, and axillary nodes normal. No abnormal inguinal nodes palpated Neurologic: Grossly normal   Pelvic: External genitalia:  no lesions              Urethra:  normal appearing urethra with no masses, tenderness or lesions              Bartholin's and Skene's: normal                 Vagina: normal appearing vagina with normal color and discharge, no lesions              Cervix: absent              Pap taken: No. 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  Counseled on  breast self exam, mammography screening, adequate intake of calcium and vitamin D, diet and exercise, Kegel's exercises return annually or prn  An After Visit Summary was printed and given to the patient.

## 2016-07-19 LAB — VITAMIN D 25 HYDROXY (VIT D DEFICIENCY, FRACTURES): Vit D, 25-Hydroxy: 69 ng/mL (ref 30–100)

## 2016-07-19 NOTE — Progress Notes (Signed)
Encounter reviewed Ramsay Bognar, MD   

## 2016-08-27 ENCOUNTER — Telehealth: Payer: Self-pay | Admitting: Cardiology

## 2016-08-27 NOTE — Telephone Encounter (Signed)
Left message for pt - no lab needed.

## 2016-08-27 NOTE — Telephone Encounter (Signed)
Patient calling, would like to verify if she needs to have any blood work completed before appt in October.

## 2016-08-27 NOTE — Telephone Encounter (Signed)
No labs needed if she had them on 06/2016. Thanks Donato SchultzMark Sabrinna Yearwood, MD

## 2016-08-27 NOTE — Telephone Encounter (Signed)
[place] is due for 1 yr f/u in [place].  She was being seen in the [facility] however it doesn't appear she has been f/u there.  Last lab [facility] in 5/18.  Will forward message for review by Dr Anne FuSkains and c/b with any orders.

## 2016-08-29 ENCOUNTER — Telehealth: Payer: Self-pay | Admitting: Cardiology

## 2016-08-29 NOTE — Telephone Encounter (Signed)
Patient calling and states that she received a letter in the mail to call and schedule her F/U appointment due for September. Patient states that someone told her that she did not need the appointment, but she was not sure who. Patient already has appointment scheduled for 11/20/16. Patient advised to keep appointment based on last OV note to F/U in 1 year and phone note from 08/27/16. Patient was made aware that she would not need labs prior to this appointment. Patient verbalized understanding and thanked me for the call.

## 2016-08-29 NOTE — Telephone Encounter (Signed)
New message ° ° ° °Pt is calling asking for a call back. She did not say what it was about. °

## 2016-11-20 ENCOUNTER — Ambulatory Visit: Payer: Medicare Other | Admitting: Cardiology

## 2016-11-20 DIAGNOSIS — Z860101 Personal history of adenomatous and serrated colon polyps: Secondary | ICD-10-CM | POA: Insufficient documentation

## 2016-12-11 ENCOUNTER — Encounter: Payer: Self-pay | Admitting: Nurse Practitioner

## 2016-12-11 ENCOUNTER — Ambulatory Visit (INDEPENDENT_AMBULATORY_CARE_PROVIDER_SITE_OTHER): Payer: Medicare Other | Admitting: Nurse Practitioner

## 2016-12-11 ENCOUNTER — Encounter (INDEPENDENT_AMBULATORY_CARE_PROVIDER_SITE_OTHER): Payer: Self-pay

## 2016-12-11 VITALS — BP 128/70 | HR 67 | Ht 61.25 in | Wt 125.1 lb

## 2016-12-11 DIAGNOSIS — R011 Cardiac murmur, unspecified: Secondary | ICD-10-CM

## 2016-12-11 DIAGNOSIS — E782 Mixed hyperlipidemia: Secondary | ICD-10-CM

## 2016-12-11 NOTE — Patient Instructions (Addendum)
We will be checking the following labs today - NONE  Fasting labs on day of echo - BMET, Lipids, HPF   Medication Instructions:    Continue with your current medicines.     Testing/Procedures To Be Arranged:  Echocardiogram  Follow-Up:   We will see you back as needed after your studies are complete.     Other Special Instructions:   N/A    If you need a refill on your cardiac medications before your next appointment, please call your pharmacy.   Call the [facility] office at [phone] if you have any questions, problems or concerns.

## 2016-12-11 NOTE — Progress Notes (Signed)
CARDIOLOGY OFFICE NOTE  Date:  12/11/2016    Rebecca Rasmussen Date of Birth: 1945/03/07 Medical Record #[id number]  PCP:  Loree Fee, FNP  Cardiologist:  [facility]  Chief Complaint  Patient presents with  . Hyperlipidemia    1 year check - seen for Dr. Anne Fu    History of Present Illness: Rebecca Rasmussen is a 71 y.o. female who presents today for a one year check. Seen for Dr. Anne Fu.   She has a history of HLD - difficult to control due to prior myalgias. Now on Crestor 5 mg twice a week and able to tolerate. Has had rash with Zetia.   Last seen 13 months ago - was doing ok.   Comes in today. Here alone. She is doing well. Tells me of a prior ER visit for sinusitis and she was told she had a murmur. No chest pain. Not short of breath. Stays active. Does some occasional walking. She feels ok on her current dose of Crestor. She is not fasting today. She has moved to [place] - plans on transitioning her cardiology care there.  She takes baby aspirin twice a day - does not wish to stop.   Past Medical History:  Diagnosis Date  . Anxiety   . Atrophic vaginitis   . DDD (degenerative disc disease), lumbar 05/2012   had a fall with sslipped disck 06/14/12  . Menopause 2001   Took ERT 08/1999 - 2/ 2007  . Osteopenia 07/2008  . Osteoporosis    osteopenia  . Premature ovarian failure age 67    Past Surgical History:  Procedure Laterality Date  . ABDOMINAL HYSTERECTOMY  08/1999   secondary to fibroids  . APPENDECTOMY    . EYE SURGERY    . TOE SURGERY Left about 2008   cyst removed under left great toe     Medications: Current Meds  Medication Sig  . Albuterol Sulfate (PROAIR RESPICLICK) 108 (90 Base) MCG/ACT AEPB Inhale 2 puffs into the lungs every 4 (four) hours as needed.  Marland Kitchen aspirin 81 MG tablet Take 81 mg by mouth 2 (two) times daily.  . Calcium Carbonate-Vitamin D (CALCIUM 600+D) 600-200 MG-UNIT TABS Take 1 tablet by mouth 2 (two) times daily.  . celecoxib (CELEBREX)  200 MG capsule Take 200 mg by mouth as needed for mild pain.  . cycloSPORINE (RESTASIS) 0.05 % ophthalmic emulsion Place 1 drop into both eyes 2 (two) times daily.  . Glucosamine-Chondroit-Vit C-Mn (GLUCOSAMINE CHONDR 1500 COMPLX) CAPS Take by mouth daily.  . hydrochlorothiazide (HYDRODIURIL) 25 MG tablet Take 25 mg by mouth daily.  . Loratadine (CLARITIN) 10 MG CAPS Take by mouth as needed. Reported on 08/05/2015  . Magnesium 400 MG CAPS Take by mouth daily.  . Multiple Vitamin (MULTIVITAMIN) tablet Take 1 tablet by mouth daily.  . Omega 3 1000 MG CAPS Take 1 capsule by mouth daily.  . rosuvastatin (CRESTOR) 5 MG tablet Patient takes 5 mg on Wed and Sun by mouth of each week at lowest dose.  . Saline (SIMPLY SALINE) 0.9 % AERS Place into the nose.  . vitamin B-12 (CYANOCOBALAMIN) 1000 MCG tablet Take 1,000 mcg by mouth daily.  . vitamin C (ASCORBIC ACID) 500 MG tablet Take 500 mg by mouth daily.  . vitamin E 100 UNIT capsule Take 400 Units by mouth daily.  . [DISCONTINUED] cyclobenzaprine (FLEXERIL) 10 MG tablet Take 10 mg by mouth at bedtime.  . [DISCONTINUED] etodolac (LODINE) 400 MG tablet Take 400 mg  by mouth 2 (two) times daily.  . [DISCONTINUED] Vitamin D, Ergocalciferol, (DRISDOL) 50000 units CAPS capsule Take 1 capsule (50,000 Units total) by mouth every 7 (seven) days.     Allergies: Allergies  Allergen Reactions  . Naproxen Sodium Rash and Anaphylaxis  . Zetia [Ezetimibe] Rash  . Crestor [Rosuvastatin Calcium] Other (See Comments)    Severe muscle pain at higher dose  . Darvocet [Propoxyphene N-Acetaminophen] Other (See Comments)    Doesn't remember reaction  . Lipitor [Atorvastatin Calcium] Other (See Comments)    Severe muscle pain  . Prednisone Other (See Comments)    Weight gain Weight gain  . Promethazine Other (See Comments)    Pt doesn't remember reaction Pt doesn't remember reaction  . Vicodin [Hydrocodone-Acetaminophen] Nausea Only  . Zocor [Simvastatin - High  Dose] Other (See Comments)    Severe muscle pain  . Amoxicillin-Pot Clavulanate Rash  . Naprosyn [Naproxen] Rash    Social History: The patient  reports that she has never smoked. She has never used smokeless tobacco. She reports that she does not drink alcohol or use drugs.   Family History: The patient's family history includes Cancer in her paternal grandfather and paternal grandmother; Cancer - Lung (age of onset: 93) in her mother; Cirrhosis (age of onset: 52) in her father; Heart failure in her maternal grandmother.   Review of Systems: Please see the history of present illness.   Otherwise, the review of systems is positive for none.   All other systems are reviewed and negative.   Physical Exam: VS:  BP 128/70   Pulse 67   Ht 5' 1.25" (1.556 m)   Wt 125 lb 1.9 oz (56.8 kg)   LMP 08/22/1999 (Exact Date)   BMI 23.45 kg/m  .  BMI Body mass index is 23.45 kg/m.  Wt Readings from Last 3 Encounters:  12/11/16 125 lb 1.9 oz (56.8 kg)  07/18/16 125 lb (56.7 kg)  02/17/16 130 lb (59 kg)    General: Pleasant. Well developed, well nourished and in no acute distress.   HEENT: Normal.  Neck: Supple, no JVD, carotid bruits, or masses noted.  Cardiac: Regular rate and rhythm. Soft outflow murmur. No edema.  Respiratory:  Lungs are clear to auscultation bilaterally with normal work of breathing.  GI: Soft and nontender.  MS: No deformity or atrophy. Gait and ROM intact.  Skin: Warm and dry. Color is normal.  Neuro:  Strength and sensation are intact and no gross focal deficits noted.  Psych: Alert, appropriate and with normal affect.   LABORATORY DATA:  EKG:  EKG is ordered today. This demonstrates NSR.  Lab Results  Component Value Date   WBC 4.4 (A) 04/22/2015   HGB 13.0 04/22/2015   HCT 38.1 04/22/2015   CHOL 259 (H) 11/23/2015   TRIG 149 11/23/2015   HDL 42 (L) 11/23/2015   LDLCALC 187 (H) 11/23/2015   ALT 17 11/23/2015   AST 21 02/07/2015     BNP (last 3  results) No results for input(s): BNP in the last 8760 hours.  ProBNP (last 3 results) No results for input(s): PROBNP in the last 8760 hours.   Other Studies Reviewed Today:  Myoview Overall Impression 2015:   Normal stress nuclear study. The patient did have symptoms. However there was no significant EKG change. The nuclear images are normal. There is no scar or ischemia. This is a low risk scan.  LV Ejection Fraction: 65%.  LV Wall Motion:  Normal Wall Motion.  Willa RoughJeffrey Katz, MD  Assessment/Plan:  1. HLD - on low dose Crestor - tolerating ok - needs fasting labs  2. Murmur - needs echo as baseline. No worrisome symptoms.   3. GERD  4. Back pain - seeing PCP tomorrow  Current medicines are reviewed with the patient today.  The patient does not have concerns regarding medicines other than what has been noted above.  The following changes have been made:  See above.  Labs/ tests ordered today include:    Orders Placed This Encounter  Procedures  . Basic metabolic panel  . Hepatic function panel  . Lipid panel  . ECHOCARDIOGRAM COMPLETE     Disposition:   FU prn after her studies are complete. [facility].   Patient is agreeable to this plan and will call if any problems develop in the interim.   SignedNorma Fredrickson: Aqsa Sensabaugh, NP  12/11/2016 10:59 AM  [facility] [address] [place], [place]  [address] Phone: [phone] Fax: [phone]

## 2016-12-17 ENCOUNTER — Other Ambulatory Visit: Payer: Medicare Other

## 2016-12-17 ENCOUNTER — Other Ambulatory Visit: Payer: Self-pay

## 2016-12-17 ENCOUNTER — Ambulatory Visit (HOSPITAL_COMMUNITY): Payer: Medicare Other | Attending: Cardiology

## 2016-12-17 DIAGNOSIS — I351 Nonrheumatic aortic (valve) insufficiency: Secondary | ICD-10-CM | POA: Insufficient documentation

## 2016-12-17 DIAGNOSIS — E782 Mixed hyperlipidemia: Secondary | ICD-10-CM | POA: Diagnosis present

## 2016-12-17 DIAGNOSIS — E785 Hyperlipidemia, unspecified: Secondary | ICD-10-CM | POA: Diagnosis not present

## 2016-12-17 NOTE — Addendum Note (Signed)
Addended by: Burnetta SabinWITTY, Ahad Colarusso K on: 12/17/2016 07:24 AM   Modules accepted: Orders

## 2016-12-18 ENCOUNTER — Telehealth: Payer: Self-pay | Admitting: Nurse Practitioner

## 2016-12-18 ENCOUNTER — Other Ambulatory Visit: Payer: Self-pay | Admitting: Occupational Therapy

## 2016-12-18 ENCOUNTER — Other Ambulatory Visit: Payer: Self-pay | Admitting: Nurse Practitioner

## 2016-12-18 DIAGNOSIS — Z1231 Encounter for screening mammogram for malignant neoplasm of breast: Secondary | ICD-10-CM

## 2016-12-18 LAB — HEPATIC FUNCTION PANEL
ALT: 17 IU/L (ref 0–32)
AST: 20 IU/L (ref 0–40)
Albumin: 4.8 g/dL (ref 3.5–4.8)
Alkaline Phosphatase: 66 IU/L (ref 39–117)
Bilirubin Total: 0.5 mg/dL (ref 0.0–1.2)
Bilirubin, Direct: 0.1 mg/dL (ref 0.00–0.40)
Total Protein: 7 g/dL (ref 6.0–8.5)

## 2016-12-18 LAB — LIPID PANEL
Chol/HDL Ratio: 6.6 ratio — ABNORMAL HIGH (ref 0.0–4.4)
Cholesterol, Total: 270 mg/dL — ABNORMAL HIGH (ref 100–199)
HDL: 41 mg/dL (ref >39)
LDL Calculated: 201 mg/dL — ABNORMAL HIGH (ref 0–99)
Triglycerides: 141 mg/dL (ref 0–149)
VLDL Cholesterol Cal: 28 mg/dL (ref 5–40)

## 2016-12-18 LAB — BASIC METABOLIC PANEL
BUN/Creatinine Ratio: 13 (ref 12–28)
BUN: 13 mg/dL (ref 8–27)
CO2: 27 mmol/L (ref 20–29)
Calcium: 9.9 mg/dL (ref 8.7–10.3)
Chloride: 101 mmol/L (ref 96–106)
Creatinine, Ser: 1.02 mg/dL — ABNORMAL HIGH (ref 0.57–1.00)
GFR calc Af Amer: 64 mL/min/{1.73_m2} (ref >59)
GFR calc non Af Amer: 56 mL/min/{1.73_m2} — ABNORMAL LOW (ref >59)
Glucose: 88 mg/dL (ref 65–99)
Potassium: 3.6 mmol/L (ref 3.5–5.2)
Sodium: 143 mmol/L (ref 134–144)

## 2016-12-18 MED ORDER — ROSUVASTATIN CALCIUM 5 MG PO TABS: ORAL_TABLET | ORAL | 3 refills | Status: DC

## 2016-12-18 NOTE — Telephone Encounter (Signed)
Follow Up:; ° ° °Returning your call. °

## 2016-12-18 NOTE — Telephone Encounter (Signed)
Patient made aware of results and recommendations to increase crestor to 3 times a week. Patient verbalizes understanding. Also made patient aware that she will need to have fasting lipids and lfts rechecked in 3 months. Patient states that she wants to have her labs drawn with her PCP. Will send a message to the patient's PCP to make them aware.

## 2016-12-18 NOTE — Telephone Encounter (Signed)
-----   Message from Rosalio MacadamiaLori C Gerhardt, NP sent at 12/18/2016  9:04 AM EDT ----- Ok to report. Labs are stable - but lipids not as good - does she think she could try to increase her Crestor to 3 times a week?? If so - please recheck lipids and LFTs in 3 months - if now - would continue on current regimen.

## 2017-01-16 ENCOUNTER — Ambulatory Visit
Admission: RE | Admit: 2017-01-16 | Discharge: 2017-01-16 | Disposition: A | Discharge status: Home / Self Care | Payer: Medicare Other | Source: Ambulatory Visit | Attending: Nurse Practitioner | Admitting: Nurse Practitioner

## 2017-01-16 DIAGNOSIS — Z1231 Encounter for screening mammogram for malignant neoplasm of breast: Secondary | ICD-10-CM

## 2017-01-31 DIAGNOSIS — M461 Sacroiliitis, not elsewhere classified: Secondary | ICD-10-CM | POA: Insufficient documentation

## 2017-02-05 ENCOUNTER — Other Ambulatory Visit: Payer: Self-pay | Admitting: Orthopedic Surgery

## 2017-02-18 ENCOUNTER — Telehealth: Payer: Self-pay | Admitting: *Deleted

## 2017-02-18 NOTE — Telephone Encounter (Signed)
   [facility] Pre-operative Risk Assessment    Request for surgical clearance:  1. What type of surgery is being performed? RIGHT THUMB SUSPENSIONPLASTY, TRAPEZIUM EXCISION, APL TENDON TRANSFER, CARPAL TUNNEL RELEASE  2. When is this surgery scheduled? 04/11/17   3. Are there any medications that need to be held prior to surgery and how long?ASA   4. Practice name and name of physician performing surgery? [facility]   5. What is your office phone and fax number? PH# [phone], FAX # [phone] ATTN: Cyndee Brightly, RN   6. Anesthesia type (None, local, MAC, general) ? AXILLARY BLOCK   Julaine Hua 02/18/2017, 1:37 PM  _________________________________________________________________   (provider comments below)

## 2017-02-18 NOTE — Telephone Encounter (Signed)
   Primary Cardiologist: Rebecca SchultzMark Skains, MD  Chart reviewed as part of pre-operative protocol coverage. Patient was contacted 02/18/2017 in reference to pre-operative risk assessment for pending surgery as outlined below.  Rebecca NettleRhea H Rasmussen was last seen by Rebecca FredricksonLori Rasmussen on 12/11/16. H/o hyperlipidemia, premature ovarian failure, anxiety, DDD, osteoporosis, mild AI, normal EF, mild diastolic dysfunction. No h/o CAD or CHF. RCRI is 0.4%. Called patient to see how she is doing and to discuss if any new concerns - got voicemail. LMOM on both home and mobile number. If patient is doing well, anticipate clearance. Given no prior history of MI, stents, TIA, or stroke, it should be OK to hold aspirin 7 days prior to procedure if felt necessary - but this final recommendation is pending discussion with patient.   Rebecca Montanaayna N Dunn, PA-C 02/18/2017, 2:47 PM

## 2017-02-20 ENCOUNTER — Telehealth: Payer: Self-pay | Admitting: Physician Assistant

## 2017-02-20 NOTE — Telephone Encounter (Signed)
New message  [facility]

## 2017-02-20 NOTE — Telephone Encounter (Signed)
   Primary Cardiologist: Donato SchultzMark Skains, MD  Chart reviewed as part of pre-operative protocol coverage. I spoke with [name] via phone today.  Since her last office visit on 12/11/16, she has continued to do well without chest pain or dyspnea.  Given past medical history and time since last visit, based on ACC/AHA guidelines, Rebecca Rasmussen would be at acceptable risk for the planned procedure without further cardiovascular testing. Given no prior history of MI, stents, TIA, or stroke, it should be OK to hold aspirin 7 days prior to procedure if felt necessary.  Please call with questions.  Nicolasa Duckinghristopher Violia Knopf, NP 02/20/2017, 4:18 PM

## 2017-03-04 ENCOUNTER — Telehealth: Payer: Self-pay | Admitting: Obstetrics and Gynecology

## 2017-03-04 NOTE — Telephone Encounter (Signed)
Patient called to let our office know [place] [facility] [place] She said she will be continuing GYN care there. 02 recall removed. Routing to provider for FYI only.

## 2017-04-01 DIAGNOSIS — E559 Vitamin D deficiency, unspecified: Secondary | ICD-10-CM | POA: Insufficient documentation

## 2017-04-04 ENCOUNTER — Encounter (HOSPITAL_BASED_OUTPATIENT_CLINIC_OR_DEPARTMENT_OTHER): Payer: Self-pay | Admitting: *Deleted

## 2017-04-11 ENCOUNTER — Ambulatory Visit (HOSPITAL_BASED_OUTPATIENT_CLINIC_OR_DEPARTMENT_OTHER): Admission: RE | Admit: 2017-04-11 | Payer: Medicare Other | Source: Ambulatory Visit | Admitting: Orthopedic Surgery

## 2017-04-11 HISTORY — DX: Essential (primary) hypertension: I10

## 2017-04-11 SURGERY — CARPAL TUNNEL RELEASE
Anesthesia: Regional | Laterality: Right

## 2017-04-22 ENCOUNTER — Telehealth: Payer: Self-pay

## 2017-04-22 ENCOUNTER — Other Ambulatory Visit: Payer: Self-pay | Admitting: Orthopedic Surgery

## 2017-04-22 NOTE — Telephone Encounter (Signed)
   [facility] Pre-operative Risk Assessment    Request for surgical clearance:  1. What type of surgery is being performed? Right carpal tunnel release, right suspensionplasty, trapezium excision and APL transfer   2. When is this surgery scheduled? 05/30/17  3. What type of clearance is required (medical clearance vs. Pharmacy clearance to hold med vs. Both)? both  4. Are there any medications that need to be held prior to surgery and how long? Aspirin    5. Practice name and name of physician performing surgery?  1. [facility] [place]  2. Dr. Daryll Brod   6. What is your office phone and fax number?  1. Phone [phone] 2. Fax [phone] ATTN: Cyndee Brightly  7. Anesthesia type (None, local, MAC, general) ? Axillary block    _______________________________________________   (provider comments below)

## 2017-04-23 NOTE — Telephone Encounter (Signed)
Detailed message left for pt to call back preop pool to discuss surgical clearance.

## 2017-04-24 NOTE — Telephone Encounter (Signed)
Follow Up:    Pt returning a call from yesterday.

## 2017-04-24 NOTE — Telephone Encounter (Signed)
   Primary Cardiologist: Donato SchultzMark Skains, MD  Chart reviewed as part of pre-operative protocol coverage. Patient was contacted 02/18/2017 in reference to pre-operative risk assessment for pending surgery as outlined below.  Rebecca Rasmussen was last seen by Norma FredricksonLori Gerhardt on 12/11/16. H/o hyperlipidemia, premature ovarian failure, anxiety, DDD, osteoporosis, mild AI, normal EF, mild diastolic dysfunction. No h/o CAD or CHF. RCRI is 0.4%. She was cleared for this surgery in 02/2017 including rec to hold aspirin 7 days prior to procedure if needed. However, procedure was postponed due to an acute illness which she has recovered from and is feeling well. Walks the dog regularly. Given past medical history and time since last visit, based on ACC/AHA guidelines, Rebecca NettleRhea H Rasmussen would be at acceptable risk for the planned procedure without further cardiovascular testing. Given no prior history of MI, stents, TIA, or stroke, it should be OK to hold aspirin 7 days prior to procedure if felt necessary. I will route this recommendation to the requesting party via Epic fax function and remove from pre-op pool.  Please call with questions.   Laurann Montanaayna N Dunn, PA-C 04/24/2017, 3:59 PM

## 2017-05-23 ENCOUNTER — Other Ambulatory Visit: Payer: Self-pay

## 2017-05-23 ENCOUNTER — Encounter (HOSPITAL_BASED_OUTPATIENT_CLINIC_OR_DEPARTMENT_OTHER): Payer: Self-pay | Admitting: *Deleted

## 2017-05-30 ENCOUNTER — Ambulatory Visit (HOSPITAL_BASED_OUTPATIENT_CLINIC_OR_DEPARTMENT_OTHER)
Admission: RE | Admit: 2017-05-30 | Discharge: 2017-05-30 | Disposition: A | Discharge status: Home / Self Care | Payer: Medicare Other | Source: Ambulatory Visit | Attending: Orthopedic Surgery | Admitting: Orthopedic Surgery

## 2017-05-30 ENCOUNTER — Encounter (HOSPITAL_BASED_OUTPATIENT_CLINIC_OR_DEPARTMENT_OTHER): Payer: Self-pay | Admitting: *Deleted

## 2017-05-30 ENCOUNTER — Encounter (HOSPITAL_BASED_OUTPATIENT_CLINIC_OR_DEPARTMENT_OTHER)
Admission: RE | Disposition: A | Discharge status: Home / Self Care | Payer: Self-pay | Source: Ambulatory Visit | Attending: Orthopedic Surgery

## 2017-05-30 ENCOUNTER — Ambulatory Visit (HOSPITAL_BASED_OUTPATIENT_CLINIC_OR_DEPARTMENT_OTHER): Payer: Medicare Other | Admitting: Anesthesiology

## 2017-05-30 ENCOUNTER — Other Ambulatory Visit: Payer: Self-pay

## 2017-05-30 DIAGNOSIS — Z8249 Family history of ischemic heart disease and other diseases of the circulatory system: Secondary | ICD-10-CM | POA: Insufficient documentation

## 2017-05-30 DIAGNOSIS — Z880 Allergy status to penicillin: Secondary | ICD-10-CM | POA: Insufficient documentation

## 2017-05-30 DIAGNOSIS — Z79899 Other long term (current) drug therapy: Secondary | ICD-10-CM | POA: Diagnosis not present

## 2017-05-30 DIAGNOSIS — Z7982 Long term (current) use of aspirin: Secondary | ICD-10-CM | POA: Insufficient documentation

## 2017-05-30 DIAGNOSIS — Z888 Allergy status to other drugs, medicaments and biological substances status: Secondary | ICD-10-CM | POA: Insufficient documentation

## 2017-05-30 DIAGNOSIS — E785 Hyperlipidemia, unspecified: Secondary | ICD-10-CM | POA: Diagnosis not present

## 2017-05-30 DIAGNOSIS — G5601 Carpal tunnel syndrome, right upper limb: Secondary | ICD-10-CM | POA: Insufficient documentation

## 2017-05-30 DIAGNOSIS — Z885 Allergy status to narcotic agent status: Secondary | ICD-10-CM | POA: Insufficient documentation

## 2017-05-30 DIAGNOSIS — I10 Essential (primary) hypertension: Secondary | ICD-10-CM | POA: Diagnosis not present

## 2017-05-30 DIAGNOSIS — M810 Age-related osteoporosis without current pathological fracture: Secondary | ICD-10-CM | POA: Diagnosis not present

## 2017-05-30 DIAGNOSIS — F419 Anxiety disorder, unspecified: Secondary | ICD-10-CM | POA: Insufficient documentation

## 2017-05-30 DIAGNOSIS — R011 Cardiac murmur, unspecified: Secondary | ICD-10-CM | POA: Diagnosis not present

## 2017-05-30 DIAGNOSIS — M1811 Unilateral primary osteoarthritis of first carpometacarpal joint, right hand: Secondary | ICD-10-CM | POA: Diagnosis not present

## 2017-05-30 DIAGNOSIS — M8580 Other specified disorders of bone density and structure, unspecified site: Secondary | ICD-10-CM | POA: Diagnosis not present

## 2017-05-30 HISTORY — PX: TENDON TRANSFER: SHX6109

## 2017-05-30 HISTORY — PX: CARPAL TUNNEL RELEASE: SHX101

## 2017-05-30 HISTORY — PX: CARPOMETACARPEL SUSPENSION PLASTY: SHX5005

## 2017-05-30 LAB — POCT I-STAT, CHEM 8
BUN: 16 mg/dL (ref 6–20)
CALCIUM ION: 1.15 mmol/L (ref 1.15–1.40)
CHLORIDE: 104 mmol/L (ref 101–111)
Creatinine, Ser: 0.8 mg/dL (ref 0.44–1.00)
GLUCOSE: 102 mg/dL — AB (ref 65–99)
HCT: 38 % (ref 36.0–46.0)
Hemoglobin: 12.9 g/dL (ref 12.0–15.0)
Potassium: 3.5 mmol/L (ref 3.5–5.1)
Sodium: 142 mmol/L (ref 135–145)
TCO2: 27 mmol/L (ref 22–32)

## 2017-05-30 SURGERY — CARPAL TUNNEL RELEASE
Anesthesia: Monitor Anesthesia Care | Site: Wrist | Laterality: Right

## 2017-05-30 MED ORDER — ONDANSETRON HCL 4 MG/2ML IJ SOLN
INTRAMUSCULAR | Status: AC
  Filled 2017-05-30: qty 2

## 2017-05-30 MED ORDER — LIDOCAINE HCL (PF) 1 % IJ SOLN
INTRAMUSCULAR | Status: AC
Start: 2017-05-30 — End: 2017-05-30
  Filled 2017-05-30: qty 60

## 2017-05-30 MED ORDER — FENTANYL CITRATE (PF) 100 MCG/2ML IJ SOLN
INTRAMUSCULAR | Status: AC
  Filled 2017-05-30: qty 2

## 2017-05-30 MED ORDER — SUCCINYLCHOLINE CHLORIDE 200 MG/10ML IV SOSY
PREFILLED_SYRINGE | INTRAVENOUS | Status: AC
  Filled 2017-05-30: qty 10

## 2017-05-30 MED ORDER — MIDAZOLAM HCL 2 MG/2ML IJ SOLN
1.0000 mg | INTRAMUSCULAR | Status: DC | PRN
  Administered 2017-05-30: 1 mg via INTRAVENOUS

## 2017-05-30 MED ORDER — MIDAZOLAM HCL 2 MG/2ML IJ SOLN
INTRAMUSCULAR | Status: AC
Start: 2017-05-30 — End: 2017-05-30
  Filled 2017-05-30: qty 2

## 2017-05-30 MED ORDER — BUPIVACAINE HCL (PF) 0.25 % IJ SOLN
INTRAMUSCULAR | Status: AC
  Filled 2017-05-30: qty 150

## 2017-05-30 MED ORDER — ONDANSETRON HCL 4 MG/2ML IJ SOLN: 4.0000 mg | Freq: Once | INTRAMUSCULAR | Status: DC | PRN

## 2017-05-30 MED ORDER — SCOPOLAMINE 1 MG/3DAYS TD PT72: 1.0000 | MEDICATED_PATCH | Freq: Once | TRANSDERMAL | Status: DC | PRN

## 2017-05-30 MED ORDER — MEPIVACAINE HCL 1.5 % IJ SOLN
INTRAMUSCULAR | Status: DC | PRN
  Administered 2017-05-30: 18 mL via PERINEURAL

## 2017-05-30 MED ORDER — ONDANSETRON HCL 4 MG/2ML IJ SOLN
INTRAMUSCULAR | Status: DC | PRN
  Administered 2017-05-30: 4 mg via INTRAVENOUS

## 2017-05-30 MED ORDER — VANCOMYCIN HCL IN DEXTROSE 1-5 GM/200ML-% IV SOLN
1000.0000 mg | INTRAVENOUS | Status: AC
  Administered 2017-05-30: 1000 mg via INTRAVENOUS

## 2017-05-30 MED ORDER — EPHEDRINE 5 MG/ML INJ
INTRAVENOUS | Status: AC
  Filled 2017-05-30: qty 10

## 2017-05-30 MED ORDER — LIDOCAINE HCL (CARDIAC) 20 MG/ML IV SOLN
INTRAVENOUS | Status: AC
  Filled 2017-05-30: qty 5

## 2017-05-30 MED ORDER — PHENYLEPHRINE 40 MCG/ML (10ML) SYRINGE FOR IV PUSH (FOR BLOOD PRESSURE SUPPORT)
PREFILLED_SYRINGE | INTRAVENOUS | Status: AC
  Filled 2017-05-30: qty 10

## 2017-05-30 MED ORDER — FENTANYL CITRATE (PF) 100 MCG/2ML IJ SOLN
25.0000 ug | INTRAMUSCULAR | Status: DC | PRN
  Administered 2017-05-30: 50 ug via INTRAVENOUS

## 2017-05-30 MED ORDER — VANCOMYCIN HCL IN DEXTROSE 1-5 GM/200ML-% IV SOLN
INTRAVENOUS | Status: AC
  Filled 2017-05-30: qty 200

## 2017-05-30 MED ORDER — PROPOFOL 500 MG/50ML IV EMUL
INTRAVENOUS | Status: DC | PRN
  Administered 2017-05-30: 100 ug/kg/min via INTRAVENOUS

## 2017-05-30 MED ORDER — LACTATED RINGERS IV SOLN
INTRAVENOUS | Status: DC
  Administered 2017-05-30: 10:00:00 via INTRAVENOUS

## 2017-05-30 MED ORDER — FENTANYL CITRATE (PF) 100 MCG/2ML IJ SOLN: 50.0000 ug | INTRAMUSCULAR | Status: DC | PRN

## 2017-05-30 MED ORDER — CHLORHEXIDINE GLUCONATE 4 % EX LIQD: 60.0000 mL | Freq: Once | CUTANEOUS | Status: DC

## 2017-05-30 MED ORDER — MIDAZOLAM HCL 2 MG/2ML IJ SOLN
INTRAMUSCULAR | Status: AC
  Filled 2017-05-30: qty 2

## 2017-05-30 MED ORDER — OXYCODONE-ACETAMINOPHEN 5-325 MG PO TABS: 1.0000 | ORAL_TABLET | ORAL | 0 refills | Status: AC | PRN

## 2017-05-30 SURGICAL SUPPLY — 80 items
BAG DECANTER FOR FLEXI CONT (MISCELLANEOUS) IMPLANT
BIT DRILL 7/64X5 DISP (BIT) ×5 IMPLANT
BLADE MINI RND TIP GREEN BEAV (BLADE) ×5 IMPLANT
BLADE SURG 15 STRL LF DISP TIS (BLADE) ×3 IMPLANT
BLADE SURG 15 STRL SS (BLADE) ×2
BNDG COHESIVE 3X5 TAN STRL LF (GAUZE/BANDAGES/DRESSINGS) ×5 IMPLANT
BNDG ESMARK 4X9 LF (GAUZE/BANDAGES/DRESSINGS) ×5 IMPLANT
BNDG GAUZE ELAST 4 BULKY (GAUZE/BANDAGES/DRESSINGS) ×5 IMPLANT
CHLORAPREP W/TINT 26ML (MISCELLANEOUS) ×5 IMPLANT
CORD BIPOLAR FORCEPS 12FT (ELECTRODE) ×5 IMPLANT
COTTONBALL LRG STERILE PKG (GAUZE/BANDAGES/DRESSINGS) IMPLANT
COVER BACK TABLE 60X90IN (DRAPES) ×5 IMPLANT
COVER MAYO STAND STRL (DRAPES) ×5 IMPLANT
CUFF TOURNIQUET SINGLE 18IN (TOURNIQUET CUFF) ×5 IMPLANT
DECANTER SPIKE VIAL GLASS SM (MISCELLANEOUS) IMPLANT
DRAIN TLS ROUND 10FR (DRAIN) IMPLANT
DRAPE EXTREMITY T 121X128X90 (DRAPE) ×5 IMPLANT
DRAPE OEC MINIVIEW 54X84 (DRAPES) ×5 IMPLANT
DRAPE SURG 17X23 STRL (DRAPES) ×5 IMPLANT
DRSG PAD ABDOMINAL 8X10 ST (GAUZE/BANDAGES/DRESSINGS) ×5 IMPLANT
GAUZE SPONGE 4X4 12PLY STRL (GAUZE/BANDAGES/DRESSINGS) ×5 IMPLANT
GAUZE SPONGE 4X4 16PLY XRAY LF (GAUZE/BANDAGES/DRESSINGS) IMPLANT
GAUZE XEROFORM 1X8 LF (GAUZE/BANDAGES/DRESSINGS) ×5 IMPLANT
GLOVE BIOGEL M 7.0 STRL (GLOVE) ×5 IMPLANT
GLOVE BIOGEL PI IND STRL 7.0 (GLOVE) ×3 IMPLANT
GLOVE BIOGEL PI IND STRL 8 (GLOVE) ×3 IMPLANT
GLOVE BIOGEL PI IND STRL 8.5 (GLOVE) ×3 IMPLANT
GLOVE BIOGEL PI INDICATOR 7.0 (GLOVE) ×2
GLOVE BIOGEL PI INDICATOR 8 (GLOVE) ×2
GLOVE BIOGEL PI INDICATOR 8.5 (GLOVE) ×2
GLOVE SURG ORTHO 8.0 STRL STRW (GLOVE) ×5 IMPLANT
GLOVE SURG SS PI 7.5 STRL IVOR (GLOVE) ×5 IMPLANT
GOWN STRL REUS W/ TWL LRG LVL3 (GOWN DISPOSABLE) ×6 IMPLANT
GOWN STRL REUS W/TWL LRG LVL3 (GOWN DISPOSABLE) ×4
GOWN STRL REUS W/TWL XL LVL3 (GOWN DISPOSABLE) ×10 IMPLANT
K-WIRE .035X4 (WIRE) IMPLANT
LOOP VESSEL MAXI BLUE (MISCELLANEOUS) IMPLANT
NEEDLE HYPO 22GX1.5 SAFETY (NEEDLE) IMPLANT
NEEDLE KEITH (NEEDLE) IMPLANT
NEEDLE PRECISIONGLIDE 27X1.5 (NEEDLE) ×5 IMPLANT
NS IRRIG 1000ML POUR BTL (IV SOLUTION) ×5 IMPLANT
PACK BASIN DAY SURGERY FS (CUSTOM PROCEDURE TRAY) ×5 IMPLANT
PAD CAST 3X4 CTTN HI CHSV (CAST SUPPLIES) ×3 IMPLANT
PADDING CAST ABS 3INX4YD NS (CAST SUPPLIES)
PADDING CAST ABS 4INX4YD NS (CAST SUPPLIES) ×2
PADDING CAST ABS COTTON 3X4 (CAST SUPPLIES) IMPLANT
PADDING CAST ABS COTTON 4X4 ST (CAST SUPPLIES) ×3 IMPLANT
PADDING CAST COTTON 3X4 STRL (CAST SUPPLIES) ×2
SLEEVE SCD COMPRESS KNEE MED (MISCELLANEOUS) ×5 IMPLANT
SPLINT PLASTER CAST XFAST 3X15 (CAST SUPPLIES) IMPLANT
SPLINT PLASTER XTRA FASTSET 3X (CAST SUPPLIES)
STOCKINETTE 4X48 STRL (DRAPES) ×5 IMPLANT
SUT CHROMIC 5 0 P 3 (SUTURE) IMPLANT
SUT ETHIBOND 3-0 V-5 (SUTURE) ×5 IMPLANT
SUT ETHILON 4 0 PS 2 18 (SUTURE) ×25 IMPLANT
SUT FIBERWIRE 2-0 18 17.9 3/8 (SUTURE)
SUT FIBERWIRE 4-0 18 DIAM BLUE (SUTURE)
SUT FIBERWIRE 4-0 18 TAPR NDL (SUTURE)
SUT MERSILENE 2.0 SH NDLE (SUTURE) IMPLANT
SUT MERSILENE 3 0 FS 1 (SUTURE) IMPLANT
SUT MERSILENE 4 0 P 3 (SUTURE) IMPLANT
SUT PROLENE 2 0 SH DA (SUTURE) IMPLANT
SUT SILK 2 0 PERMA HAND 18 BK (SUTURE) IMPLANT
SUT SILK 4 0 PS 2 (SUTURE) IMPLANT
SUT STEEL 3 0 (SUTURE) ×5 IMPLANT
SUT VIC AB 3-0 PS1 18 (SUTURE)
SUT VIC AB 3-0 PS1 18XBRD (SUTURE) IMPLANT
SUT VIC AB 4-0 P-3 18XBRD (SUTURE) ×3 IMPLANT
SUT VIC AB 4-0 P2 18 (SUTURE) IMPLANT
SUT VIC AB 4-0 P3 18 (SUTURE) ×2
SUT VICRYL 4-0 PS2 18IN ABS (SUTURE) IMPLANT
SUTURE FIBERWR 2-0 18 17.9 3/8 (SUTURE) IMPLANT
SUTURE FIBERWR 4-0 18 DIA BLUE (SUTURE) IMPLANT
SUTURE FIBERWR 4-0 18 TAPR NDL (SUTURE) IMPLANT
SYR BULB 3OZ (MISCELLANEOUS) ×5 IMPLANT
SYR CONTROL 10ML LL (SYRINGE) ×5 IMPLANT
TOWEL OR 17X24 6PK STRL BLUE (TOWEL DISPOSABLE) ×10 IMPLANT
TOWEL OR NON WOVEN STRL DISP B (DISPOSABLE) ×5 IMPLANT
TUBE FEEDING ENTERAL 5FR 16IN (TUBING) IMPLANT
UNDERPAD 30X30 (UNDERPADS AND DIAPERS) ×5 IMPLANT

## 2017-05-30 NOTE — Discharge Instructions (Addendum)

## 2017-05-30 NOTE — Anesthesia Postprocedure Evaluation (Signed)
Anesthesia Post Note  Patient: Rebecca Rasmussen  Procedure(s) Performed: RIGHT CARPAL TUNNEL RELEASE (Right Wrist) RIGHT CARPOMETACARPEL (CMC) SUSPENSION PLASTY TRAPEZIUM EXCISION (Right Thumb) ABDUCTOR POLLICIS LONGUS TRANSFER (Right Wrist)     Patient location during evaluation: PACU Anesthesia Type: Regional Level of consciousness: awake and alert Pain management: pain level controlled Vital Signs Assessment: post-procedure vital signs reviewed and stable Respiratory status: spontaneous breathing, nonlabored ventilation, respiratory function stable and patient connected to nasal cannula oxygen Cardiovascular status: stable and blood pressure returned to baseline Postop Assessment: no apparent nausea or vomiting Anesthetic complications: no    Last Vitals:  Vitals:   05/30/17 1400 05/30/17 1445  BP: (!) 153/70 (!) 161/75  Pulse: (!) 50 68  Resp: 18 16  Temp:  36.4 C  SpO2: 100% 94%    Last Pain:  Vitals:   05/30/17 1445  TempSrc:   PainSc: 0-No pain                 Maneh Sieben P Mattheo Swindle

## 2017-05-30 NOTE — Anesthesia Preprocedure Evaluation (Addendum)
Anesthesia Evaluation  Patient identified by MRN, date of birth, ID band Patient awake    Reviewed: Allergy & Precautions, NPO status , Patient's Chart, lab work & pertinent test results  Airway Mallampati: III  TM Distance: >3 FB Neck ROM: Full    Dental  (+) Partial Lower   Pulmonary neg pulmonary ROS,    Pulmonary exam normal breath sounds clear to auscultation       Cardiovascular hypertension, Pt. on medications Normal cardiovascular exam+ Valvular Problems/Murmurs AI  Rhythm:Regular Rate:Normal  ECG: NSR, rate 67  ECHO: Normal LV systolic function; mild diastolic dysfunction; mild AI.  Sees cardiologist Marlou Porch)   Neuro/Psych Anxiety negative neurological ROS     GI/Hepatic negative GI ROS, Neg liver ROS,   Endo/Other  negative endocrine ROS  Renal/GU negative Renal ROS     Musculoskeletal negative musculoskeletal ROS (+)   Abdominal   Peds  Hematology HLD   Anesthesia Other Findings right carpal tunnel [facility] Arthritis right thumb    Reproductive/Obstetrics                            Anesthesia Physical Anesthesia Plan  ASA: II  Anesthesia Plan: Regional and MAC   Post-op Pain Management:    Induction: Intravenous  PONV Risk Score and Plan: 2 and Ondansetron, Dexamethasone and Treatment may vary due to age or medical condition  Airway Management Planned: Natural Airway  Additional Equipment:   Intra-op Plan:   Post-operative Plan:   Informed Consent: I have reviewed the patients History and Physical, chart, labs and discussed the procedure including the risks, benefits and alternatives for the proposed anesthesia with the patient or authorized representative who has indicated his/her understanding and acceptance.   Dental advisory given  Plan Discussed with: CRNA  Anesthesia Plan Comments:        Anesthesia Quick Evaluation

## 2017-05-30 NOTE — Op Note (Signed)
I assisted Surgeon(s) and Role:    * Cindee SaltKuzma, Gary, MD - Primary    * Betha LoaKuzma, Kytzia Gienger, MD - Assisting on the Procedure(s): RIGHT CARPAL TUNNEL RELEASE RIGHT CARPOMETACARPEL [facility](CMC) SUSPENSION PLASTY TRAPEZIUM EXCISION ABDUCTOR POLLICIS LONGUS TRANSFER on 05/30/2017.  I provided assistance on this case as follows: retraction soft tissues, harvesting of tendon and passage of tendon, closure wounds.  Electronically signed by: Tami RibasKUZMA,Kale Rondeau R, MD Date: 05/30/2017 Time: 12:30 PM

## 2017-05-30 NOTE — Brief Op Note (Signed)
05/30/2017  12:31 PM  PATIENT:  Rebecca Rasmussen  72 y.o. female  PRE-OPERATIVE DIAGNOSIS:  right carpal tunnel CMC Arthritis right thumb  G56.01  M18.0  POST-OPERATIVE DIAGNOSIS:  right carpal tunnel CMC Arthritis right thumb  G56.01  M18.0  PROCEDURE:  Procedure(s): RIGHT CARPAL TUNNEL RELEASE (Right) RIGHT CARPOMETACARPEL (CMC) SUSPENSION PLASTY TRAPEZIUM EXCISION (Right) ABDUCTOR POLLICIS LONGUS TRANSFER (Right)  SURGEON:  Surgeon(s) and Role:    * Cindee SaltKuzma, Oliver Neuwirth, MD - Primary    * Betha LoaKuzma, Kevin, MD - Assisting  PHYSICIAN ASSISTANT:   ASSISTANTS: K Tniyah Nakagawa,MD    ANESTHESIA:   regional and IV sedation  EBL:  10 mL   BLOOD ADMINISTERED:none  DRAINS: none   LOCAL MEDICATIONS USED:  NONE  SPECIMEN:  No Specimen  DISPOSITION OF SPECIMEN:  N/A  COUNTS:  YES  TOURNIQUET:   Total Tourniquet Time Documented: Upper Arm (Right) - 56 minutes Total: Upper Arm (Right) - 56 minutes   DICTATION: .Other Dictation: Dictation Number [id number]  PLAN OF CARE: Discharge to home after PACU  PATIENT DISPOSITION:  PACU - hemodynamically stable.

## 2017-05-30 NOTE — Transfer of Care (Signed)
2Immediate Anesthesia Transfer of Care Note  Patient: Rebecca Rasmussen  Procedure(s) Performed: RIGHT CARPAL TUNNEL RELEASE (Right Wrist) RIGHT CARPOMETACARPEL (CMC) SUSPENSION PLASTY TRAPEZIUM EXCISION (Right Thumb) ABDUCTOR POLLICIS LONGUS TRANSFER (Right Wrist)  Patient Location: PACU  Anesthesia Type:GA combined with regional for post-op pain  Level of Consciousness: sedated  Airway & Oxygen Therapy: Patient Spontanous Breathing and Patient connected to face mask oxygen  Post-op Assessment: Report given to RN and Post -op Vital signs reviewed and stable  Post vital signs: Reviewed and stable  Last Vitals:  Vitals Value Taken Time  BP    Temp    Pulse    Resp    SpO2      Last Pain:  Vitals:   05/30/17 0930  TempSrc: Oral  PainSc: 0-No pain         Complications: No apparent anesthesia complications

## 2017-05-30 NOTE — Progress Notes (Signed)
Assisted Dr. Ellender with right, ultrasound guided, supraclavicular block. Side rails up, monitors on throughout procedure. See vital signs in flow sheet. Tolerated Procedure well. 

## 2017-05-30 NOTE — Anesthesia Procedure Notes (Signed)
Anesthesia Regional Block: Supraclavicular block   Pre-Anesthetic Checklist: ,, timeout performed, Correct Patient, Correct Site, Correct Laterality, Correct Procedure,, site marked, risks and benefits discussed, Surgical consent,  Pre-op evaluation,  At surgeon's request and post-op pain management  Laterality: Right  Prep: chloraprep       Needles:  Injection technique: Single-shot  Needle Type: Echogenic Stimulator Needle     Needle Length: 10cm  Needle Gauge: 21     Additional Needles:   Procedures:,,,, ultrasound used (permanent image in chart),,,,  Narrative:  Start time: 05/30/2017 10:20 AM End time: 05/30/2017 10:30 AM Injection made incrementally with aspirations every 5 mL.  Performed by: Personally  Anesthesiologist: Leonides GrillsEllender, Ryan P, MD  Additional Notes: Functioning IV was confirmed and monitors were applied.  A 100mm 21ga Pajunk echogenic stimulator needle was used. Sterile prep and drape,hand hygiene and sterile gloves were used.  Negative aspiration and negative test dose prior to incremental administration of local anesthetic. The patient tolerated the procedure well.

## 2017-05-30 NOTE — Op Note (Signed)
Other Dictation: Dictation Number [id number]

## 2017-05-30 NOTE — Op Note (Signed)
NAMEUMI, MAINOR NO.:  [id number]  MEDICAL RECORD NO.:  [id number]  LOCATION:                                 FACILITY:  PHYSICIAN:  Cindee Salt, M.D.            DATE OF BIRTH:  DATE OF PROCEDURE:  05/30/2017 DATE OF DISCHARGE:                              OPERATIVE REPORT   PREOPERATIVE DIAGNOSIS:  Carpal tunnel syndrome, right hand with carpometacarpal arthritis, right thumb.  POSTOPERATIVE DIAGNOSIS:  Carpal tunnel syndrome, right hand with carpometacarpal arthritis, right thumb.  OPERATION:  Carpal tunnel release, right hand with carpometacarpal arthroplasty, right thumb using the abductor pollicis longus with excision of trapezium.  SURGEON:  Cindee Salt, MD.  ASSISTANT:  Betha Loa, MD.  ANESTHESIA:  Supraclavicular block with IV sedation.  PLACE OF SURGERY:  [facility].  HISTORY:  The patient is a 72 year old female with a history of carpal tunnel syndrome, nerve conductions positive and arthritis at the basilar joint of her thumb, which has not responded to conservative treatment over a prolonged period of time.  She has elected to undergo surgical release of the carpal canal with suspension plasty, abductor pollicis longus tendon transfer, and excision of trapezium, right hand, right thumb.  She is aware that there is no guarantee to the surgery; the possibility of infection; recurrence; incomplete relief of symptoms; dystrophy; possibility of further intervention being necessary for the thumb; possibility of injury to arteries, nerves, and tendons.  In preoperative area, the patient was given a supraclavicular block brought to the operating room, where general anesthetic was added to this.  She was prepped and draped using ChloraPrep in a supine position with right arm free.  A 3-minute dry time was allowed and a time-out taken, confirming the patient and procedure.  The carpal tunnel was addressed first.  The limb  exsanguinated with an Esmarch bandage.  Tourniquet placed high on the arm was inflated to 250 mmHg.  A longitudinal incision was made in the right palm, carried down through subcutaneous tissue.  Bleeders were electrocauterized with bipolar.  The palmar fascia was split revealing the superficial palmar arch.  Retractors were placed.  After identifying the flexor tendon to the ring and little finger, this allowed retraction of the median nerve radially and ulnar nerve ulnarly.  The flexor retinaculum was then incised on its ulnar border.  A right angle and Sewell retractor were placed between skin and forearm fascia.  The deep structures were dissected free with blunt dissection.  Blunt scissors were then used to release the proximal aspect of the flexor retinaculum distal margin of the forearm fascia for approximately 2-3 cm proximal to the wrist crease under direct vision. Canal was explored.  Motor branch entered into muscle distally.  A persistent median artery was present.  The wound was copiously irrigated with saline.  The skin was closed interrupted 4-0 nylon sutures.  Hockey- stick incision was then made over the base of the thumb metacarpal carried along the first dorsal extensor compartment.  This was carried down through subcutaneous tissue.  The radial nerve was identified and protected.  The dissection carried between the extensor  pollicis brevis and abductor pollicis longus.  [facility] was opened.  The STT joint was identified along with the radial artery proximally.  The periosteum was then elevated off from the trapezium.  The trapezoid and trapezial joint were identified.  The ligaments were then incised.  This allowed the trapezium to be removed in 2 large pieces.  Any osteophytes were removed with a rongeur.  A drill hole was then placed in the base of the thumb metacarpal from the dorsal to palmar, radial to ulnar direction. The most dorsal component of the abductor  pollicis longus tendon was separated off left attached to the base of the thumb metacarpal.  A separate incision was then made proximally at the musculotendinous junction after identifying the pull of the abductor pollicis longus.  A monofilament wire was then used to dissect free the most dorsal half after passing a Health visitor[facility]roll tendon retriever down the first dorsal extensor compartment.  This allowed transection of the musculotendinous junction proximally.  The wound was irrigated and the skin closed with interrupted 4-0 nylon sutures.  A 7/64 inch drill hole was then placed in the base of the index metacarpal centering this just distal to the articulation of the first and second metacarpal bases.  This was passed in a volar to dorsal direction radial to ulnar through the base of the index metacarpal.  The abductor pollicis longus tendon was then passed through the base of the thumb metacarpal in a radial to ulnar direction and then through the base of the index metacarpal in a volar to dorsal direction. An incision was made to allow passage of this in the interspace between the index and middle fingers.  A hemostat was then used to bring the abductor pollicis longus back into the defect created by excision of the trapezium.  This was done by passing the hemostat directly on the periosteum of the index metacarpal base.  The tendon was then weaved through the abductor pollicis longus and flexor pollicis longus to created a space between the index and thumb metacarpal bases and to suspend it from the index metacarpal.  This was sutured into position with multiple figure-of-eight 3-0 Ethibond sutures.  The wound was irrigated.  The capsule closed as much as possible with interrupted 4-0 Vicryl sutures, the subcutaneous with interrupted 4-0 Vicryl, and the skin with interrupted 4-0 nylon sutures on the incision at the base of the thumb and base of the index.  An x-rays were taken prior to  closure to confirm that the thumb was adequately suspended.  The position appeared good on multiple views.  A sterile compressive dressing and dorsal palmar thumb spica splint were applied.  On deflation of the tourniquet, all fingers immediately pinked.  She was taken to the recovery room for observation in satisfactory condition.  She will be discharged to home [facility] [place] [facility] [place], on Percocet.          ______________________________ Cindee SaltGary Sreekar Broyhill, M.D.     GK/MEDQ  D:  05/30/2017  T:  05/30/2017  Job:  [id number]

## 2017-05-30 NOTE — H&P (Signed)
Rebecca Rasmussen is an 72 y.o. female.   Chief Complaint: numbness and pain right handHPI: Rebecca BurlyRhea is a 72 yo female with bilateral CMC arthritis and carpal tunnel syndrome for several years. This has not responded to conservative entreatment including several injections. Nerve conduction tests are abnormal. She has decided she wants to go ahead and have the right side operated. She localizes pain at the carpometacarpal areas of thumbs bilaterally. She has been taking Celebrex for discomfort. She also takes Tylenol. She has no history of diabetes thyroid problems she does have arthritis. She has no history of gout. Family history is negative      Past Medical History:  Diagnosis Date  . Anxiety   . Atrophic vaginitis   . DDD (degenerative disc disease), lumbar 05/2012   had a fall with sslipped disck 06/14/12  . Hypertension   . Menopause 2001   Took ERT 08/1999 - 2/ 2007  . Osteopenia 07/2008  . Osteoporosis    osteopenia  . Premature ovarian failure age 72    Past Surgical History:  Procedure Laterality Date  . ABDOMINAL HYSTERECTOMY  08/1999   secondary to fibroids  . APPENDECTOMY    . EYE SURGERY    . TOE SURGERY Left about 2008   cyst removed under left great toe    Family History  Problem Relation Age of Onset  . Cancer - Lung Mother [id number]  . Cirrhosis Father [id number]  . Heart failure Maternal Grandmother   . Cancer Paternal Grandmother   . Cancer Paternal Grandfather    Social History:  reports that she has never smoked. She has never used smokeless tobacco. She reports that she does not drink alcohol or use drugs.  Allergies:  Allergies  Allergen Reactions  . Naproxen Sodium Rash and Anaphylaxis  . Zetia [Ezetimibe] Rash  . Crestor [Rosuvastatin Calcium] Other (See Comments)    Severe muscle pain at higher dose  . Darvocet [Propoxyphene N-Acetaminophen] Other (See Comments)    Doesn't remember reaction  . Lipitor [Atorvastatin Calcium] Other (See Comments)    Severe muscle  pain  . Prednisone Other (See Comments)    Weight gain Weight gain  . Promethazine Other (See Comments)    Pt doesn't remember reaction Pt doesn't remember reaction  . Vicodin [Hydrocodone-Acetaminophen] Nausea Only  . Zocor [Simvastatin - High Dose] Other (See Comments)    Severe muscle pain  . Amoxicillin-Pot Clavulanate Rash  . Naprosyn [Naproxen] Rash    No medications prior to admission.    No results found for this or any previous visit (from the past 48 hour(s)).  No results found.   Pertinent items are noted in HPI.  Height 5' 1.25" (1.556 m), weight 56.7 kg (125 lb), last menstrual period 08/22/1999.  General appearance: alert, cooperative and appears stated age Head: Normocephalic, without obvious abnormality Neck: no JVD Resp: clear to auscultation bilaterally Cardio: murmur GI: soft, non-tender; bowel sounds normal; no masses,  no organomegaly Extremities: numbness right hand pain in thumb Pulses: 2+ and symmetric Skin: Skin color, texture, turgor normal. No rashes or lesions Neurologic: Grossly normal Incision/Wound: na  Assessment/Plan Assessment:   1. Primary osteoarthritis of both first carpometacarpal joints    Plan:  He would like to proceed to have the right side surgically taken care of. I we have recommended suspension plasty along with carpal tunnel release. Pre-peri-and postoperative course been discussed along with risks and complications. She is aware there is no guarantee to the surgery the possibility of  infection recurrence injury to arteries nerves tendons incomplete release symptoms dystrophy the amount of time it takes for recovery from the suspension plasty. She would like to have this scheduled in approximately February. She is scheduled for suspension plasty right thumb carpal tunnel release right hand as an outpatient under regional anesthesia.         Stepen Prins R 05/30/2017, 8:34 AM

## 2017-05-31 ENCOUNTER — Encounter (HOSPITAL_BASED_OUTPATIENT_CLINIC_OR_DEPARTMENT_OTHER): Payer: Self-pay | Admitting: Orthopedic Surgery

## 2017-08-12 DIAGNOSIS — M3501 Sicca syndrome with keratoconjunctivitis: Secondary | ICD-10-CM | POA: Insufficient documentation

## 2017-10-23 DIAGNOSIS — M65331 Trigger finger, right middle finger: Secondary | ICD-10-CM | POA: Insufficient documentation

## 2017-10-23 DIAGNOSIS — M542 Cervicalgia: Secondary | ICD-10-CM | POA: Insufficient documentation

## 2018-08-11 ENCOUNTER — Other Ambulatory Visit: Payer: Self-pay | Admitting: *Deleted

## 2018-08-11 DIAGNOSIS — Z20822 Contact with and (suspected) exposure to covid-19: Secondary | ICD-10-CM

## 2018-08-17 LAB — NOVEL CORONAVIRUS, NAA: SARS-CoV-2, NAA: NOT DETECTED

## 2018-10-22 ENCOUNTER — Telehealth: Payer: Self-pay | Admitting: Cardiology

## 2018-10-22 NOTE — Telephone Encounter (Signed)
Called pt to schedule 2018 recall, patient states she has moved to [facility] and has a cardiologist there.

## 2018-10-24 DIAGNOSIS — H18519 Endothelial corneal dystrophy, unspecified eye: Secondary | ICD-10-CM | POA: Insufficient documentation

## 2018-10-24 DIAGNOSIS — H2512 Age-related nuclear cataract, left eye: Secondary | ICD-10-CM | POA: Insufficient documentation

## 2018-10-24 DIAGNOSIS — H25012 Cortical age-related cataract, left eye: Secondary | ICD-10-CM | POA: Insufficient documentation

## 2018-12-28 DIAGNOSIS — Z947 Corneal transplant status: Secondary | ICD-10-CM | POA: Insufficient documentation

## 2018-12-28 DIAGNOSIS — Z961 Presence of intraocular lens: Secondary | ICD-10-CM | POA: Insufficient documentation

## 2019-01-22 DIAGNOSIS — N183 Chronic kidney disease, stage 3 unspecified: Secondary | ICD-10-CM | POA: Insufficient documentation

## 2019-07-24 DIAGNOSIS — D709 Neutropenia, unspecified: Secondary | ICD-10-CM | POA: Insufficient documentation

## 2019-12-21 DIAGNOSIS — H04123 Dry eye syndrome of bilateral lacrimal glands: Secondary | ICD-10-CM | POA: Insufficient documentation

## 2020-08-31 DIAGNOSIS — I351 Nonrheumatic aortic (valve) insufficiency: Secondary | ICD-10-CM | POA: Insufficient documentation

## 2020-08-31 DIAGNOSIS — Z8679 Personal history of other diseases of the circulatory system: Secondary | ICD-10-CM | POA: Insufficient documentation

## 2020-08-31 DIAGNOSIS — I071 Rheumatic tricuspid insufficiency: Secondary | ICD-10-CM | POA: Insufficient documentation

## 2021-01-12 DIAGNOSIS — H2512 Age-related nuclear cataract, left eye: Secondary | ICD-10-CM | POA: Insufficient documentation

## 2021-08-17 DIAGNOSIS — R7301 Impaired fasting glucose: Secondary | ICD-10-CM | POA: Insufficient documentation

## 2022-07-26 ENCOUNTER — Ambulatory Visit (INDEPENDENT_AMBULATORY_CARE_PROVIDER_SITE_OTHER): Payer: Medicare PPO

## 2022-07-26 ENCOUNTER — Ambulatory Visit
Admission: EM | Admit: 2022-07-26 | Discharge: 2022-07-26 | Disposition: A | Discharge status: Home / Self Care | Payer: Medicare PPO | Attending: Emergency Medicine | Admitting: Emergency Medicine

## 2022-07-26 DIAGNOSIS — M7741 Metatarsalgia, right foot: Secondary | ICD-10-CM

## 2022-07-26 DIAGNOSIS — M7742 Metatarsalgia, left foot: Secondary | ICD-10-CM

## 2022-07-26 NOTE — ED Triage Notes (Signed)
Left big toe is swollen and painful and after someone stepped on it pain and swelling worsened two days  ago. States she has bilateral foot swelling and redness around the lateral sides of feet.

## 2022-07-26 NOTE — ED Provider Notes (Addendum)
[facility]    CSN: [id number] Arrival date & time: 07/26/22  1736    HISTORY   Chief Complaint  Patient presents with   Toe Pain   HPI Rebecca Rasmussen is a pleasant, 77 y.o. female who presents to urgent care today. Pt c/o left big toe being swollen and painful and after someone stepped on it two days ago. States it was already hurting before this happened but now it is worse. States she tried to bet an appt with her podiatrist but they could not see her until next month so she decided to come here to UC.  Pt states she also has some pain and swelling in her right great toe as well but not as bad as her left, states no one stepped on her right foot.  Pt reports bilateral foot swelling and redness around the lateral sides of both feet, left > right.  EMR reviewed.  Pt was seen by podiatry.02/2022.  She was dx with metatarsalgia and advised to buy shoes that fit better due to the ones she was wearing being too short in the toe.  She states that the shoes she is wearing today "fit fine".  Pt is requesting imaging of both feet to rule out broken bones. Pt does have hx osteopenia. Pt ambulated independently into the clinic today.   The history is provided by the patient.   Past Medical History:  Diagnosis Date   Anxiety    Atrophic vaginitis    DDD (degenerative disc disease), lumbar 05/2012   had a fall with sslipped disck 06/14/12   Hypertension    Menopause 2001   Took ERT 08/1999 - 2/ 2007   Osteopenia 07/2008   Osteoporosis    osteopenia   Premature ovarian failure age 47   Patient Active Problem List   Diagnosis Date Noted   Gastroesophageal reflux disease without esophagitis 11/18/2014   Other chest pain 10/28/2013   Pure hypercholesterolemia 10/28/2013   Statin intolerance 10/28/2013   Osteoporosis    Menopause    Atrophic vaginitis    Anxiety    Premature ovarian failure    DDD (degenerative disc disease), lumbar 05/20/2012   Osteopenia 07/20/2008   Past  Surgical History:  Procedure Laterality Date   ABDOMINAL HYSTERECTOMY  08/1999   secondary to fibroids   APPENDECTOMY     CARPAL TUNNEL RELEASE Right 05/30/2017   Procedure: RIGHT CARPAL TUNNEL RELEASE;  Surgeon: Cindee Salt, MD;  Location: [facility];  Service: Orthopedics;  Laterality: Right;   CARPOMETACARPEL SUSPENSION PLASTY Right 05/30/2017   Procedure: RIGHT CARPOMETACARPEL [facility]) SUSPENSION PLASTY TRAPEZIUM EXCISION;  Surgeon: Cindee Salt, MD;  Location: [facility];  Service: Orthopedics;  Laterality: Right;   EYE SURGERY     TENDON TRANSFER Right 05/30/2017   Procedure: ABDUCTOR POLLICIS LONGUS TRANSFER;  Surgeon: Cindee Salt, MD;  Location: [facility];  Service: Orthopedics;  Laterality: Right;   TOE SURGERY Left about 2008   cyst removed under left great toe   OB History     Gravida  0   Para  0   Term  0   Preterm  0   AB  0   Living  0      SAB  0   IAB  0   Ectopic  0   Multiple  0   Live Births  0          Home Medications    Prior to  Admission medications   Medication Sig Start Date End Date Taking? Authorizing Provider  Albuterol Sulfate (PROAIR RESPICLICK) 108 (90 Base) MCG/ACT AEPB Inhale 2 puffs into the lungs every 4 (four) hours as needed. 08/05/15   Sherren Mocha, MD  aspirin 81 MG tablet Take 81 mg by mouth 2 (two) times daily.    [provider]  Calcium Carbonate-Vitamin D (CALCIUM 600+D) 600-200 MG-UNIT TABS Take 1 tablet by mouth 2 (two) times daily.    [provider]  celecoxib (CELEBREX) 200 MG capsule Take 200 mg by mouth as needed for mild pain.    [provider]  cycloSPORINE (RESTASIS) 0.05 % ophthalmic emulsion Place 1 drop into both eyes 2 (two) times daily.    [provider]  Glucosamine-Chondroit-Vit C-Mn (GLUCOSAMINE CHONDR 1500 COMPLX) CAPS Take by mouth daily.    [provider]  hydrochlorothiazide (HYDRODIURIL) 25 MG tablet Take 25 mg  by mouth daily.    [provider]  Loratadine (CLARITIN) 10 MG CAPS Take by mouth as needed. Reported on 08/05/2015    [provider]  Magnesium 400 MG CAPS Take by mouth daily.    [provider]  Multiple Vitamin (MULTIVITAMIN) tablet Take 1 tablet by mouth daily.    [provider]  Omega 3 1000 MG CAPS Take 1 capsule by mouth daily.    [provider]  prednisoLONE acetate (PRED FORTE) 1 % ophthalmic suspension 1 drop 2 (two) times daily.    [provider]  rosuvastatin (CRESTOR) 5 MG tablet Take 5 mg (1 tablet) three times a week 12/18/16   Rosalio Macadamia, NP  Saline (SIMPLY SALINE) 0.9 % AERS Place into the nose.    [provider]  vitamin B-12 (CYANOCOBALAMIN) 1000 MCG tablet Take 1,000 mcg by mouth daily.    [provider]  vitamin C (ASCORBIC ACID) 500 MG tablet Take 500 mg by mouth daily.    [provider]  vitamin E 100 UNIT capsule Take 400 Units by mouth daily.    [provider]    Family History Family History  Problem Relation Age of Onset   Cancer - Lung Mother 17   Cirrhosis Father 27   Heart failure Maternal Grandmother    Cancer Paternal Grandmother    Cancer Paternal Grandfather    Social History Social History   Tobacco Use   Smoking status: Never   Smokeless tobacco: Never  Vaping Use   Vaping Use: Never used  Substance Use Topics   Alcohol use: No   Drug use: No   Allergies   Naproxen sodium, Zetia [ezetimibe], Clindamycin/lincomycin, Propoxyphene, Tizanidine, Atorvastatin calcium, Crestor [rosuvastatin calcium], Darvocet [propoxyphene n-acetaminophen], Hydrocodone-acetaminophen, Lipitor [atorvastatin calcium], Prednisone, Promethazine, Vicodin [hydrocodone-acetaminophen], Zocor [simvastatin - high dose], Amoxicillin-pot clavulanate, and Naprosyn [naproxen]  Review of Systems Review of Systems Pertinent findings revealed after performing a 14 point review of  systems has been noted in the history of present illness.  Physical Exam Vital Signs BP 136/73 (BP Location: Right Arm)   Pulse (!) 58   Temp 98.4 F (36.9 C)   Resp 16   LMP 08/22/1999 (Exact Date)   SpO2 95%   No data found.  Physical Exam Vitals and nursing note reviewed.  Constitutional:      General: She is not in acute distress.    Appearance: Normal appearance.  HENT:     Head: Normocephalic and atraumatic.  Eyes:     Pupils: Pupils are equal, round, and  reactive to light.  Cardiovascular:     Rate and Rhythm: Normal rate and regular rhythm.  Pulmonary:     Effort: Pulmonary effort is normal.     Breath sounds: Normal breath sounds.  Musculoskeletal:        General: Normal range of motion.     Cervical back: Normal range of motion and neck supple.     Comments: Hallux valgus of both great toes, mild erythema appreciated on both sides of both feet, no swelling appreciated on exam.   Skin:    General: Skin is warm and dry.  Neurological:     General: No focal deficit present.     Mental Status: She is alert and oriented to person, place, and time. Mental status is at baseline.  Psychiatric:        Mood and Affect: Mood normal.        Behavior: Behavior normal.        Thought Content: Thought content normal.        Judgment: Judgment normal.     Visual Acuity Right Eye Distance:   Left Eye Distance:   Bilateral Distance:    Right Eye Near:   Left Eye Near:    Bilateral Near:     UC Couse / Diagnostics / Procedures:     Radiology DG Foot Complete Right  Result Date: 07/26/2022 CLINICAL DATA:  Right foot pain, initial encounter EXAM: RIGHT FOOT COMPLETE - 3+ VIEW COMPARISON:  None Available. FINDINGS: Calcaneal spurring is noted. Tarsal degenerative changes are seen. No acute fracture or dislocation is noted. No soft tissue abnormality is noted. IMPRESSION: No acute abnormality noted. Electronically Signed   By: Alcide Clever M.D.   On: 07/26/2022 19:05    DG Foot Complete Left  Result Date: 07/26/2022 CLINICAL DATA:  Foot pain and swelling after injury. EXAM: LEFT FOOT - COMPLETE 3+ VIEW COMPARISON:  Left foot x-ray 01/24/2005 FINDINGS: There is no evidence of fracture or dislocation. There are plantar and posterior calcaneal spurs present. There are mild degenerative changes of the first metatarsophalangeal joint. Soft tissues are unremarkable. IMPRESSION: 1. No acute fracture or dislocation. 2. Mild degenerative changes of the first metatarsophalangeal joint. Electronically Signed   By: Darliss Cheney M.D.   On: 07/26/2022 19:04    Procedures Procedures (including critical care time) EKG  Pending results:  Labs Reviewed - No data to display  Medications Ordered in UC: Medications - No data to display  UC Diagnoses / Final Clinical Impressions(s)   I have reviewed the triage vital signs and the nursing notes.  Pertinent labs & imaging results that were available during my care of the patient were reviewed by me and considered in my medical decision making (see chart for details).    Final diagnoses:  Metatarsalgia of both feet   Pt advised of chronic changes in both feet.  Proper fitting shoes recommended.  F/u with podiatry.   Please see discharge instructions below for details of plan of care as provided to patient. ED Prescriptions   None    PDMP not reviewed this encounter.  Pending results:  Labs Reviewed - No data to display  Discharge Instructions:   Discharge Instructions      The x-ray images of your feet did not show any evidence of acute fracture.  They did show chronic changes and abnormal curvature of the bones in your feet likely related to wearing shoes that are too small.    I recommend that you  wear shoes that are roomy around the toes so that you are able to wiggle your toes freely when you are wearing them.  This will allow the joints in your feet to spread out more naturally and hopefully relieve your  swelling and pain.    You may also consider wearing compression stockings, 20 - 30 mm Hg of pressure daily to avoid swelling in your feet.   Please follow up with your podiatrist if these recommendations are not beneficial.   Thank you for visiting [facility].  We appreciate the opportunity to participate in your care.        Disposition Upon Discharge:  Condition: stable for discharge home  Patient presented with an acute illness with associated systemic symptoms and significant discomfort requiring urgent management. In my opinion, this is a condition that a prudent lay person (someone who possesses an average knowledge of health and medicine) may potentially expect to result in complications if not addressed urgently such as respiratory distress, impairment of bodily function or dysfunction of bodily organs.   Routine symptom specific, illness specific and/or disease specific instructions were discussed with the patient and/or caregiver at length.   As such, the patient has been evaluated and assessed, work-up was performed and treatment was provided in alignment with urgent care protocols and evidence based medicine.  Patient/parent/caregiver has been advised that the patient may require follow up for further testing and treatment if the symptoms continue in spite of treatment, as clinically indicated and appropriate.  Patient/parent/caregiver has been advised to return to the [facility] or PCP if no better; to PCP or the Emergency Department if new signs and symptoms develop, or if the current signs or symptoms continue to change or worsen for further workup, evaluation and treatment as clinically indicated and appropriate  The patient will follow up with their current PCP if and as advised. If the patient does not currently have a PCP we will assist them in obtaining one.   The patient may need specialty follow up if the symptoms continue, in spite of conservative treatment and  management, for further workup, evaluation, consultation and treatment as clinically indicated and appropriate.  Patient/parent/caregiver verbalized understanding and agreement of plan as discussed.  All questions were addressed during visit.  Please see discharge instructions below for further details of plan.  This office note has been dictated using Teaching laboratory technician.  Unfortunately, this method of dictation can sometimes lead to typographical or grammatical errors.  I apologize for your inconvenience in advance if this occurs.  Please do not hesitate to reach out to me if clarification is needed.      Theadora Rama Scales, PA-C 07/26/22 1902    Theadora Rama Scales, PA-C 07/26/22 1910

## 2022-07-26 NOTE — Discharge Instructions (Addendum)
The x-ray images of your feet did not show any evidence of acute fracture.  They did show chronic changes and abnormal curvature of the bones in your feet likely related to wearing shoes that are too small.    I recommend that you wear shoes that are roomy around the toes so that you are able to wiggle your toes freely when you are wearing them.  This will allow the joints in your feet to spread out more naturally and hopefully relieve your swelling and pain.    You may also consider wearing compression stockings, 20 - 30 mm Hg of pressure daily to avoid swelling in your feet.   Please follow up with your podiatrist if these recommendations are not beneficial.   Thank you for visiting [facility].  We appreciate the opportunity to participate in your care.

## 2022-08-05 DIAGNOSIS — W2210XA Striking against or struck by unspecified automobile airbag, initial encounter: Secondary | ICD-10-CM | POA: Insufficient documentation

## 2022-10-08 ENCOUNTER — Ambulatory Visit
Admission: EM | Admit: 2022-10-08 | Discharge: 2022-10-08 | Disposition: A | Discharge status: Home / Self Care | Payer: Medicare PPO

## 2022-10-08 DIAGNOSIS — H1131 Conjunctival hemorrhage, right eye: Secondary | ICD-10-CM

## 2022-10-08 NOTE — ED Triage Notes (Signed)
Pt c/o of right eye redness starting today. Denies eye irritation/pain, eye drainage, vision changes, HA or known injury.

## 2022-10-08 NOTE — ED Provider Notes (Signed)
[facility]  Note:  This document was prepared using Dragon voice recognition software and may include unintentional dictation errors.  MRN: [id number] DOB: 1945-04-22 DATE: 10/08/22   Subjective:  Chief Complaint:  Chief Complaint  Patient presents with   Conjunctivitis    HPI: Rebecca Rasmussen is a 77 y.o. female presenting for redness in her right eye for one day. Reports no known injury. She looked in the mirror today and noticed her eye was red at that time. Reports no pain, irritation, discharge, or vision changes.  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 She reports no recent coughing, but she has been sneezing more. Denies fever, nausea/vomiting, headache, vision changes, loss of vision. Endorses redness in the right eye. Presents NAD.  Prior to Admission medications   Medication Sig Start Date End Date Taking? Authorizing Provider  Ascorbic Acid (VITAMIN C) 500 MG CAPS Take by mouth. 06/26/17  Yes [provider]  acetaminophen (TYLENOL) 500 MG tablet Take by mouth.    [provider]  cycloSPORINE (RESTASIS) 0.05 % ophthalmic emulsion Place 1 drop into both eyes 2 (two) times daily.    [provider]  loratadine (CLARITIN) 10 MG tablet Take by mouth.    [provider]  Magnesium 400 MG CAPS Take by mouth.    [provider]  Omega-3 Fatty Acids (FISH OIL) 1000 MG CAPS Take 1 capsule by mouth every morning.    [provider]  Vitamin E 45 MG (100 UNIT) CAPS Take by mouth.    [provider]     Allergies  Allergen Reactions   Naproxen Sodium Rash and Anaphylaxis   Zetia [Ezetimibe] Rash   Clindamycin/Lincomycin Rash   Propoxyphene Other (See Comments) and Rash    Doesn't remember reaction  Doesn't remember reaction    DARVOCET N  Doesn't remember reaction   Tizanidine Other (See Comments)    Dry mouth   Atorvastatin Calcium Other (See Comments)    Severe  muscle pain   Crestor [Rosuvastatin Calcium] Other (See Comments)    Severe muscle pain at higher dose   Darvocet [Propoxyphene N-Acetaminophen] Other (See Comments)    Doesn't remember reaction   Hydrocodone-Acetaminophen Other (See Comments)   Lipitor [Atorvastatin Calcium] Other (See Comments)    Severe muscle pain   Prednisone Other (See Comments)    Weight gain Weight gain   Promethazine Other (See Comments)    Pt doesn't remember reaction Pt doesn't remember reaction   Vicodin [Hydrocodone-Acetaminophen] Nausea Only   Zocor [Simvastatin - High Dose] Other (See Comments)    Severe muscle pain   Amoxicillin-Pot Clavulanate Rash   Naprosyn [Naproxen] Rash    History:   Past Medical History:  Diagnosis Date   Anxiety    Atrophic vaginitis    DDD (degenerative disc disease), lumbar 05/2012   had a fall with sslipped disck 06/14/12   Hypertension    Menopause 2001   Took ERT 08/1999 - 2/ 2007   Osteopenia 07/2008   Osteoporosis    osteopenia   Premature ovarian failure age 15     Past Surgical History:  Procedure Laterality Date   ABDOMINAL HYSTERECTOMY  08/1999   secondary to fibroids   APPENDECTOMY     CARPAL TUNNEL RELEASE Right 05/30/2017   Procedure: RIGHT CARPAL TUNNEL RELEASE;  Surgeon: Cindee Salt, MD;  Location: [facility];  Service: Orthopedics;  Laterality: Right;   CARPOMETACARPEL SUSPENSION PLASTY Right  05/30/2017   Procedure: RIGHT CARPOMETACARPEL ([facility]) SUSPENSION PLASTY TRAPEZIUM EXCISION;  Surgeon: Cindee Salt, MD;  Location: [facility];  Service: Orthopedics;  Laterality: Right;   EYE SURGERY     TENDON TRANSFER Right 05/30/2017   Procedure: ABDUCTOR POLLICIS LONGUS TRANSFER;  Surgeon: Cindee Salt, MD;  Location:  [facility];  Service: Orthopedics;  Laterality: Right;   TOE SURGERY Left about 2008   cyst removed under left great toe    Family History  Problem Relation Age of Onset   Cancer - Lung Mother 14    Cirrhosis Father 71   Heart failure Maternal Grandmother    Cancer Paternal Grandmother    Cancer Paternal Grandfather     Social History   Tobacco Use   Smoking status: Never   Smokeless tobacco: Never  Vaping Use   Vaping status: Never Used  Substance Use Topics   Alcohol use: No   Drug use: No    Review of Systems  Constitutional:  Negative for fever.  HENT:  Positive for sneezing.   Eyes:  Positive for redness. Negative for photophobia, pain, discharge, itching and visual disturbance.  Respiratory:  Negative for cough.   Gastrointestinal:  Negative for nausea and vomiting.  Neurological:  Negative for headaches.     Objective:   Vitals: BP (!) 149/65 (BP Location: Right Arm)   Pulse (!) 52   Temp 98.8 F (37.1 C) (Oral)   Resp 16   LMP 08/22/1999 (Exact Date)   SpO2 97%   Physical Exam Constitutional:      General: She is not in acute distress.    Appearance: Normal appearance. She is well-developed and normal weight. She is not ill-appearing or toxic-appearing.  HENT:     Head: Normocephalic and atraumatic.  Eyes:     Extraocular Movements: Extraocular movements intact.     Conjunctiva/sclera:     Right eye: Hemorrhage present.     Left eye: Left conjunctiva is not injected. No exudate or hemorrhage.    Pupils: Pupils are equal, round, and reactive to light.  Cardiovascular:     Rate and Rhythm: Normal rate and regular rhythm.     Heart sounds: Normal heart sounds.  Pulmonary:     Effort: Pulmonary effort is normal.     Breath sounds: Normal breath sounds.     Comments: Clear to auscultation bilaterally  Abdominal:     General: Bowel sounds are normal.     Palpations: Abdomen is soft.     Tenderness: There is no abdominal tenderness.  Skin:    General: Skin is warm and dry.  Neurological:     General: No focal deficit present.     Mental Status: She is alert.  Psychiatric:        Mood and Affect: Mood and affect normal.     Results:   Labs: No results found for this or any previous visit (from the past 24 hour(s)).  Radiology: No results found.   UC Course/Treatments:  Procedures: Procedures   Medications Ordered in UC: Medications - No data to display   Assessment and Plan :     ICD-10-CM   1. Subconjunctival hemorrhage of right eye  H11.31      Subconjunctival hemorrhage of right eye Afebrile, nontoxic-appearing, NAD. VSS. DDX includes but not limited to: Subconjunctival hemorrhage, bacterial conjunctivitis, allergic conjunctivitis, secondary to corneal transplant On exam subconjunctival hemorrhage noted on the right eye.  Patient reports being asymptomatic.  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  Suspect symptoms are related to her frequent sneezing versus secondary to medications.  Recommend she follow-up with her eye doctor given history of corneal transplant. Strict ED precautions were given and patient verbalized understanding.  ED Discharge Orders     None        PDMP not reviewed this encounter.     Cynda Acres, PA-C 10/08/22 1937

## 2022-10-08 NOTE — Discharge Instructions (Addendum)
I recommend you call your eye doctor tomorrow to discuss your drops and if they need to be stopped while your eye is red.

## 2022-10-23 DIAGNOSIS — H1131 Conjunctival hemorrhage, right eye: Secondary | ICD-10-CM | POA: Insufficient documentation

## 2023-02-24 ENCOUNTER — Ambulatory Visit
Admission: EM | Admit: 2023-02-24 | Discharge: 2023-02-24 | Disposition: A | Discharge status: Home / Self Care | Payer: Medicare PPO | Attending: Emergency Medicine | Admitting: Emergency Medicine

## 2023-02-24 DIAGNOSIS — J3089 Other allergic rhinitis: Secondary | ICD-10-CM

## 2023-02-24 DIAGNOSIS — J069 Acute upper respiratory infection, unspecified: Secondary | ICD-10-CM | POA: Diagnosis not present

## 2023-02-24 DIAGNOSIS — J329 Chronic sinusitis, unspecified: Secondary | ICD-10-CM

## 2023-02-24 MED ORDER — IPRATROPIUM BROMIDE 0.06 % NA SOLN: 2.0000 | Freq: Three times a day (TID) | NASAL | 2 refills | Status: AC

## 2023-02-24 MED ORDER — FLUTICASONE PROPIONATE 50 MCG/ACT NA SUSP: 1.0000 | Freq: Every day | NASAL | 2 refills | Status: AC

## 2023-02-24 NOTE — ED Triage Notes (Signed)
 Pt c/o sneezing, nasal drainage, and pressure behind eyes x3 days. Endorses fatigue. Denies any other symptoms. Has tried Flonase, Claritin, and saline rinse without relief

## 2023-02-24 NOTE — Discharge Instructions (Signed)
 Your symptoms and physical exam findings are concerning for a viral respiratory infection.  Because your upper respiratory allergies are not as well controlled at this time, perhaps due to seasonal changes, this makes you more susceptible to catching respiratory infections such as a common cold.  Based on physical exam findings, I do not believe that you need COVID-19 or influenza testing.  Your lungs sound clear today so I do not believe that RSV testing is needed either.   Please read below to learn more about the medications, dosages and frequencies that I recommend to help alleviate your symptoms and to get you feeling better soon:  Claritin (loratadine): This is an excellent second-generation antihistamine that helps to reduce respiratory inflammatory response to environmental allergens.  This medication is not known to cause daytime sleepiness so it can be taken in the daytime.  If you find that it does make you sleepy, please feel free to take it at bedtime.   Flonase  (fluticasone ): This is a steroid nasal spray that is typically used once daily, 1 spray in each nare.  For the next 3 to 5 days, please instill 1 spray in each nare twice daily.  Flonase  works best when used on a daily basis. After 3 to 5 days of twice daily use, you will notice significant reduction of the inflammation and mucus production that is currently being caused by exposure to a virus or 2 allergens, whether seasonal or environmental.  The most common side effect of this medication is nosebleeds.  If you experience a nosebleed, please discontinue use for 1 week, then feel free to resume.  If you find that your insurance will not pay for this medication, please consider a different nasal steroids such as Nasonex (mometasone), or Nasacort (triamcinolone).   Atrovent  (ipratropium): This is an excellent nasal decongestant spray that will not cause rebound congestion.  Please instill 2 sprays into each nare after using the nasal  steroid and repeat use of Atrovent  2 more times throughout the day as needed for continued sneezing, runny nose and postnasal drip.  I have added this nasal spray to the nasal steroid because nasal steroids can take several days to reach full effect.  Once you find that you are forgetting to use the spray more often than you remember to use it, you will know that you no longer need it.     If symptoms have not meaningfully improved in the next 5 to 7 days, please return for repeat evaluation or follow-up with your regular provider.  If symptoms have worsened in the next 3 to 5 days, please return for repeat evaluation or follow-up with your regular provider.    Thank you for visiting urgent care today.  We appreciate the opportunity to participate in your care.

## 2023-02-24 NOTE — ED Provider Notes (Signed)
 [facility]    CSN: [id number] Arrival date & time: 02/24/23  9088    HISTORY   Chief Complaint  Patient presents with  . Nasal Congestion   HPI Rebecca Rasmussen is a pleasant, 78 y.o. female who presents to urgent care today. Patient complains of frequent sneezing, postnasal drip and pressure behind both of her eyes for the past 3 days.  Patient states she is also feeling more tired than usual.  Patient denies fever, body aches, chills, loss of taste or smell, otalgia, nasal congestion, purulent drainage from nose.  Patient states she has a lifelong history of allergies, currently taking Claritin and Flonase  and performing twice daily saline rinses without meaningful relief.  Patient states the saline rinses helped for a little bit but after about 30 minutes of sneezing and the runny nose comes back.  Patient denies known sick contacts.  The history is provided by the patient.   Past Medical History:  Diagnosis Date  . Anxiety   . Atrophic vaginitis   . DDD (degenerative disc disease), lumbar 05/2012   had a fall with sslipped disck 06/14/12  . Hypertension   . Menopause 2001   Took ERT 08/1999 - 2/ 2007  . Osteopenia 07/2008  . Osteoporosis    osteopenia  . Premature ovarian failure age 80   Patient Active Problem List   Diagnosis Date Noted  . Gastroesophageal reflux disease without esophagitis 11/18/2014  . Other chest pain 10/28/2013  . Pure hypercholesterolemia 10/28/2013  . Statin intolerance 10/28/2013  . Osteoporosis   . Menopause   . Atrophic vaginitis   . Anxiety   . Premature ovarian failure   . DDD (degenerative disc disease), lumbar 05/20/2012  . Osteopenia 07/20/2008   Past Surgical History:  Procedure Laterality Date  . ABDOMINAL HYSTERECTOMY  08/1999   secondary to fibroids  . APPENDECTOMY    . CARPAL TUNNEL RELEASE Right 05/30/2017   Procedure: RIGHT CARPAL TUNNEL RELEASE;  Surgeon: Murrell Kuba, MD;  Location: [facility];   Service: Orthopedics;  Laterality: Right;  . CARPOMETACARPEL SUSPENSION PLASTY Right 05/30/2017   Procedure: RIGHT CARPOMETACARPEL [facility]) SUSPENSION PLASTY TRAPEZIUM EXCISION;  Surgeon: Murrell Kuba, MD;  Location: [facility];  Service: Orthopedics;  Laterality: Right;  . EYE SURGERY    . TENDON TRANSFER Right 05/30/2017   Procedure: ABDUCTOR POLLICIS LONGUS TRANSFER;  Surgeon: Murrell Kuba, MD;  Location: [facility];  Service: Orthopedics;  Laterality: Right;  . TOE SURGERY Left about 2008   cyst removed under left great toe   OB History     Gravida  0   Para  0   Term  0   Preterm  0   AB  0   Living  0      SAB  0   IAB  0   Ectopic  0   Multiple  0   Live Births  0          Home Medications    Prior to Admission medications   Medication Sig Start Date End Date Taking? Authorizing Provider  fluticasone  (FLONASE ) 50 MCG/ACT nasal spray Place 1 spray into both nostrils daily. Begin by using 2 sprays in each nare daily for 3 to 5 days, then decrease to 1 spray in each nare daily. 02/24/23  Yes Joesph Shaver Scales, PA-C  ipratropium (ATROVENT ) 0.06 % nasal spray Place 2 sprays into both nostrils 3 (three) times daily. As needed for nasal congestion,  runny nose 02/24/23  Yes Joesph Shaver Scales, PA-C  acetaminophen  (TYLENOL ) 500 MG tablet Take by mouth.    [provider]  Ascorbic Acid (VITAMIN C) 500 MG CAPS Take by mouth. 06/26/17   [provider]  cycloSPORINE (RESTASIS) 0.05 % ophthalmic emulsion Place 1 drop into both eyes 2 (two) times daily.    [provider]  loratadine (CLARITIN) 10 MG tablet Take by mouth.    [provider]  Magnesium 400 MG CAPS Take by mouth.    [provider]  Omega-3 Fatty Acids (FISH OIL) 1000 MG CAPS Take 1 capsule by mouth every morning.    [provider]  Vitamin E 45 MG (100 UNIT) CAPS Take by mouth.    [provider]    Family  History Family History  Problem Relation Age of Onset  . Cancer - Lung Mother 51  . Cirrhosis Father 75  . Heart failure Maternal Grandmother   . Cancer Paternal Grandmother   . Cancer Paternal Grandfather    Social History Social History   Tobacco Use  . Smoking status: Never  . Smokeless tobacco: Never  Vaping Use  . Vaping status: Never Used  Substance Use Topics  . Alcohol use: No  . Drug use: No   Allergies   Naproxen sodium, Zetia [ezetimibe], Clindamycin/lincomycin, Propoxyphene, Tizanidine, Atorvastatin calcium , Crestor  [rosuvastatin  calcium ], Darvocet [propoxyphene n-acetaminophen ], Hydrocodone -acetaminophen , Lipitor [atorvastatin calcium ], Prednisone, Promethazine, Vicodin [hydrocodone -acetaminophen ], Zocor [simvastatin - high dose], Amoxicillin-pot clavulanate, and Naprosyn [naproxen]  Review of Systems Review of Systems Pertinent findings revealed after performing a 14 point review of systems has been noted in the history of present illness.  Physical Exam Vital Signs BP 124/68 (BP Location: Right Arm)   Pulse 73   Temp 99.7 F (37.6 C) (Oral)   Resp 18   LMP 08/22/1999 (Exact Date)   SpO2 93%   No data found.  Physical Exam Vitals and nursing note reviewed.  Constitutional:      General: She is not in acute distress.    Appearance: Normal appearance. She is not ill-appearing.  HENT:     Head: Normocephalic and atraumatic.     Salivary Glands: Right salivary gland is not diffusely enlarged or tender. Left salivary gland is not diffusely enlarged or tender.     Right Ear: Ear canal and external ear normal. No drainage. A middle ear effusion is present. There is no impacted cerumen. Tympanic membrane is bulging. Tympanic membrane is not injected or erythematous.     Left Ear: Ear canal and external ear normal. No drainage. A middle ear effusion is present. There is no impacted cerumen. Tympanic membrane is bulging. Tympanic membrane is not injected or  erythematous.     Ears:     Comments: Bilateral EACs normal, both TMs bulging with clear fluid    Nose: Rhinorrhea present. No nasal deformity, septal deviation, signs of injury, nasal tenderness, mucosal edema or congestion. Rhinorrhea is clear.     Right Nostril: Occlusion present. No foreign body, epistaxis or septal hematoma.     Left Nostril: Occlusion present. No foreign body, epistaxis or septal hematoma.     Right Turbinates: Enlarged, swollen and pale.     Left Turbinates: Enlarged, swollen and pale.     Right Sinus: No maxillary sinus tenderness or frontal sinus tenderness.     Left Sinus: No maxillary sinus tenderness or frontal sinus tenderness.     Mouth/Throat:     Lips: Pink. No lesions.  Mouth: Mucous membranes are moist. No oral lesions.     Pharynx: Oropharynx is clear. Uvula midline. No posterior oropharyngeal erythema or uvula swelling.     Tonsils: No tonsillar exudate. 0 on the right. 0 on the left.     Comments: Postnasal drip Eyes:     General: Lids are normal.        Right eye: No discharge.        Left eye: No discharge.     Extraocular Movements: Extraocular movements intact.     Conjunctiva/sclera: Conjunctivae normal.     Right eye: Right conjunctiva is not injected.     Left eye: Left conjunctiva is not injected.  Neck:     Trachea: Trachea and phonation normal.  Cardiovascular:     Rate and Rhythm: Normal rate and regular rhythm.     Pulses: Normal pulses.     Heart sounds: Normal heart sounds. No murmur heard.    No friction rub. No gallop.  Pulmonary:     Effort: Pulmonary effort is normal. No accessory muscle usage, prolonged expiration or respiratory distress.     Breath sounds: Normal breath sounds. No stridor, decreased air movement or transmitted upper airway sounds. No decreased breath sounds, wheezing, rhonchi or rales.  Chest:     Chest wall: No tenderness.  Musculoskeletal:        General: Normal range of motion.     Cervical back:  Normal range of motion and neck supple. Normal range of motion.  Lymphadenopathy:     Cervical: No cervical adenopathy.  Skin:    General: Skin is warm and dry.     Findings: No erythema or rash.  Neurological:     General: No focal deficit present.     Mental Status: She is alert and oriented to person, place, and time.  Psychiatric:        Mood and Affect: Mood normal.        Behavior: Behavior normal.    Visual Acuity Right Eye Distance:   Left Eye Distance:   Bilateral Distance:    Right Eye Near:   Left Eye Near:    Bilateral Near:     UC Couse / Diagnostics / Procedures:     Radiology No results found.  Procedures Procedures (including critical care time) EKG  Pending results:  Labs Reviewed - No data to display  Medications Ordered in UC: Medications - No data to display  UC Diagnoses / Final Clinical Impressions(s)   I have reviewed the triage vital signs and the nursing notes.  Pertinent labs & imaging results that were available during my care of the patient were reviewed by me and considered in my medical decision making (see chart for details).    Final diagnoses:  Rhinosinusitis  Viral URI  Perennial allergic rhinitis   Physical exam findings not concerning for influenza, COVID-19 or RSV.  Viral testing not indicated.  Believe that patient's allergies have become exacerbated due to exposure to a mild viral URI, recommend continue Claritin and Flonase , Flonase  refilled as patient states she has run out, and add Atrovent  nasal spray to her regimen.  Avoid use of Nettie pot for the next 3 to 4 days to avoid rinsing away topical layer of steroid nasal spray.  Conservative care recommended.  Return precautions advised.  Please see discharge instructions below for details of plan of care as provided to patient. ED Prescriptions     Medication Sig Dispense Auth. Provider   ipratropium (  ATROVENT ) 0.06 % nasal spray Place 2 sprays into both nostrils 3  (three) times daily. As needed for nasal congestion, runny nose 15 mL Joesph Shaver Scales, PA-C   fluticasone  (FLONASE ) 50 MCG/ACT nasal spray Place 1 spray into both nostrils daily. Begin by using 2 sprays in each nare daily for 3 to 5 days, then decrease to 1 spray in each nare daily. 16 each Joesph Shaver Scales, PA-C      PDMP not reviewed this encounter.  Pending results:  Labs Reviewed - No data to display    Discharge Instructions      Your symptoms and physical exam findings are concerning for a viral respiratory infection.  Because your upper respiratory allergies are not as well controlled at this time, perhaps due to seasonal changes, this makes you more susceptible to catching respiratory infections such as a common cold.  Based on physical exam findings, I do not believe that you need COVID-19 or influenza testing.  Your lungs sound clear today so I do not believe that RSV testing is needed either.   Please read below to learn more about the medications, dosages and frequencies that I recommend to help alleviate your symptoms and to get you feeling better soon:  Claritin (loratadine): This is an excellent second-generation antihistamine that helps to reduce respiratory inflammatory response to environmental allergens.  This medication is not known to cause daytime sleepiness so it can be taken in the daytime.  If you find that it does make you sleepy, please feel free to take it at bedtime.   Flonase  (fluticasone ): This is a steroid nasal spray that is typically used once daily, 1 spray in each nare.  For the next 3 to 5 days, please instill 1 spray in each nare twice daily.  Flonase  works best when used on a daily basis. After 3 to 5 days of twice daily use, you will notice significant reduction of the inflammation and mucus production that is currently being caused by exposure to a virus or 2 allergens, whether seasonal or environmental.  The most common side effect of this  medication is nosebleeds.  If you experience a nosebleed, please discontinue use for 1 week, then feel free to resume.  If you find that your insurance will not pay for this medication, please consider a different nasal steroids such as Nasonex (mometasone), or Nasacort (triamcinolone).   Atrovent  (ipratropium): This is an excellent nasal decongestant spray that will not cause rebound congestion.  Please instill 2 sprays into each nare after using the nasal steroid and repeat use of Atrovent  2 more times throughout the day as needed for continued sneezing, runny nose and postnasal drip.  I have added this nasal spray to the nasal steroid because nasal steroids can take several days to reach full effect.  Once you find that you are forgetting to use the spray more often than you remember to use it, you will know that you no longer need it.     If symptoms have not meaningfully improved in the next 5 to 7 days, please return for repeat evaluation or follow-up with your regular provider.  If symptoms have worsened in the next 3 to 5 days, please return for repeat evaluation or follow-up with your regular provider.    Thank you for visiting urgent care today.  We appreciate the opportunity to participate in your care.     Disposition Upon Discharge:  Condition: stable for discharge home  Patient presented with an acute  illness with associated systemic symptoms and significant discomfort requiring urgent management. In my opinion, this is a condition that a prudent lay person (someone who possesses an average knowledge of health and medicine) may potentially expect to result in complications if not addressed urgently such as respiratory distress, impairment of bodily function or dysfunction of bodily organs.   Routine symptom specific, illness specific and/or disease specific instructions were discussed with the patient and/or caregiver at length.   As such, the patient has been evaluated and assessed,  work-up was performed and treatment was provided in alignment with urgent care protocols and evidence based medicine.  Patient/parent/caregiver has been advised that the patient may require follow up for further testing and treatment if the symptoms continue in spite of treatment, as clinically indicated and appropriate.  Patient/parent/caregiver has been advised to return to the [facility] or PCP if no better; to PCP or the Emergency Department if new signs and symptoms develop, or if the current signs or symptoms continue to change or worsen for further workup, evaluation and treatment as clinically indicated and appropriate  The patient will follow up with their current PCP if and as advised. If the patient does not currently have a PCP we will assist them in obtaining one.   The patient may need specialty follow up if the symptoms continue, in spite of conservative treatment and management, for further workup, evaluation, consultation and treatment as clinically indicated and appropriate.  Patient/parent/caregiver verbalized understanding and agreement of plan as discussed.  All questions were addressed during visit.  Please see discharge instructions below for further details of plan.  This office note has been dictated using Teaching laboratory technician.  Unfortunately, this method of dictation can sometimes lead to typographical or grammatical errors.  I apologize for your inconvenience in advance if this occurs.  Please do not hesitate to reach out to me if clarification is needed.      Joesph Shaver Scales, PA-C 02/24/23 [phone]

## 2023-04-03 ENCOUNTER — Ambulatory Visit
Admission: EM | Admit: 2023-04-03 | Discharge: 2023-04-03 | Disposition: A | Discharge status: Home / Self Care | Payer: Medicare PPO | Attending: Internal Medicine | Admitting: Internal Medicine

## 2023-04-03 ENCOUNTER — Other Ambulatory Visit: Payer: Self-pay

## 2023-04-03 DIAGNOSIS — H1032 Unspecified acute conjunctivitis, left eye: Secondary | ICD-10-CM | POA: Diagnosis not present

## 2023-04-03 DIAGNOSIS — H00015 Hordeolum externum left lower eyelid: Secondary | ICD-10-CM | POA: Diagnosis not present

## 2023-04-03 MED ORDER — POLYMYXIN B-TRIMETHOPRIM 10000-0.1 UNIT/ML-% OP SOLN: 1.0000 [drp] | OPHTHALMIC | 0 refills | Status: AC

## 2023-04-03 NOTE — ED Triage Notes (Signed)
Patient C/O left eye redness and drainage since last night. Patient is using some prescribed medications for her eye.

## 2023-04-03 NOTE — ED Provider Notes (Signed)
[facility]  Note:  This document was prepared using Dragon voice recognition software and may include unintentional dictation errors.  MRN: [id number] DOB: 1945-12-29 DATE: 04/03/23   Subjective:  Chief Complaint:  Chief Complaint  Patient presents with   Eye Pain     HPI: Rebecca Rasmussen is a 78 y.o. female presenting for left eye pain and discharge for one day. Patient states yesterday she started having pain and redness of her left lower eyelid. She used her prescribed restasis drops as well as cold compress with no improvement. She states this morning the pain has improved, but she had discharge and crusting of the left eye. Reports no pain in the eye itself just the lower eye lid. She reports no vision changes or loss of vision. She saw her eye doctor last week for a check up and everything was "normal" at that time. 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 Denies fever, nausea/vomiting, headache, vision changes, loss of vision. Endorses pain in left lower eyelid, crusting, discharge, redness. Presents NAD.  Prior to Admission medications   Medication Sig Start Date End Date Taking? Authorizing Provider  Ascorbic Acid (VITAMIN C) 500 MG CAPS Take by mouth. 06/26/17  Yes [provider]  cycloSPORINE (RESTASIS) 0.05 % ophthalmic emulsion Place 1 drop into both eyes 2 (two) times daily.   Yes [provider]  fluticasone (FLONASE) 50 MCG/ACT nasal spray Place 1 spray into both nostrils daily. Begin by using 2 sprays in each nare daily for 3 to 5 days, then decrease to 1 spray in each nare daily. 02/24/23  Yes Theadora Rama Scales, PA-C  ipratropium (ATROVENT) 0.06 % nasal spray Place 2 sprays into both nostrils 3 (three) times daily. As needed for nasal congestion, runny nose 02/24/23  Yes Theadora Rama Scales, PA-C  loratadine (CLARITIN) 10 MG tablet Take by mouth.   Yes [provider]   Magnesium 400 MG CAPS Take by mouth.   Yes [provider]  Omega-3 Fatty Acids (FISH OIL) 1000 MG CAPS Take 1 capsule by mouth every morning.   Yes [provider]  Vitamin E 45 MG (100 UNIT) CAPS Take by mouth.   Yes [provider]  acetaminophen (TYLENOL) 500 MG tablet Take by mouth.    [provider]     Allergies  Allergen Reactions   Naproxen Sodium Rash and Anaphylaxis   Zetia [Ezetimibe] Rash   Clindamycin/Lincomycin Rash   Propoxyphene Other (See Comments) and Rash    Doesn't remember reaction  Doesn't remember reaction    DARVOCET N  Doesn't remember reaction   Tizanidine Other (See Comments)    Dry mouth   Atorvastatin Calcium Other (See Comments)    Severe muscle pain   Crestor [Rosuvastatin Calcium] Other (See Comments)    Severe muscle pain at higher dose   Darvocet [Propoxyphene N-Acetaminophen] Other (See Comments)    Doesn't remember reaction   Hydrocodone-Acetaminophen Other (See Comments)   Lipitor [Atorvastatin Calcium] Other (See Comments)    Severe muscle pain   Prednisone Other (See Comments)    Weight gain Weight gain   Promethazine Other (See Comments)    Pt doesn't remember reaction Pt doesn't remember reaction   Vicodin [Hydrocodone-Acetaminophen] Nausea Only   Zocor [Simvastatin - High Dose] Other (See Comments)    Severe muscle pain   Amoxicillin-Pot Clavulanate Rash   Naprosyn [Naproxen] Rash    History:  Past Medical History:  Diagnosis Date   Anxiety    Atrophic vaginitis    DDD (degenerative disc disease), lumbar 05/2012   had a fall with sslipped disck 06/14/12   Hypertension    Menopause 2001   Took ERT 08/1999 - 2/ 2007   Osteopenia 07/2008   Osteoporosis    osteopenia   Premature ovarian failure age 25     Past Surgical History:  Procedure Laterality Date   ABDOMINAL HYSTERECTOMY  08/1999   secondary to fibroids   APPENDECTOMY     CARPAL TUNNEL RELEASE Right 05/30/2017    Procedure: RIGHT CARPAL TUNNEL RELEASE;  Surgeon: Cindee Salt, MD;  Location: [facility];  Service: Orthopedics;  Laterality: Right;   CARPOMETACARPEL SUSPENSION PLASTY Right 05/30/2017   Procedure: RIGHT CARPOMETACARPEL [facility]) SUSPENSION PLASTY TRAPEZIUM EXCISION;  Surgeon: Cindee Salt, MD;  Location: [facility];  Service: Orthopedics;  Laterality: Right;   EYE SURGERY     TENDON TRANSFER Right 05/30/2017   Procedure: ABDUCTOR POLLICIS LONGUS TRANSFER;  Surgeon: Cindee Salt, MD;  Location: [facility];  Service: Orthopedics;  Laterality: Right;   TOE SURGERY Left about 2008   cyst removed under left great toe    Family History  Problem Relation Age of Onset   Cancer - Lung Mother 65   Cirrhosis Father 62   Heart failure Maternal Grandmother    Cancer Paternal Grandmother    Cancer Paternal Grandfather     Social History   Tobacco Use   Smoking status: Never   Smokeless tobacco: Never  Vaping Use   Vaping status: Never Used  Substance Use Topics   Alcohol use: No   Drug use: No    Review of Systems  Constitutional:  Negative for fever.  Eyes:  Positive for pain, discharge and redness. Negative for itching and visual disturbance.  Gastrointestinal:  Negative for nausea and vomiting.  Neurological:  Negative for headaches.     Objective:   Vitals: BP (!) 157/73 (BP Location: Right Arm)   Pulse 68   Temp 97.9 F (36.6 C) (Oral)   Resp 16   LMP 08/22/1999 (Exact Date)   SpO2 95%   Physical Exam Constitutional:      General: She is not in acute distress.    Appearance: Normal appearance. She is well-developed and normal weight. She is not ill-appearing or toxic-appearing.  HENT:     Head: Normocephalic and atraumatic.  Eyes:     General:        Right eye: No discharge or hordeolum.        Left eye: Discharge and hordeolum present.    Extraocular Movements: Extraocular movements intact.     Conjunctiva/sclera:     Right  eye: Right conjunctiva is not injected. No hemorrhage.    Left eye: Left conjunctiva is injected. Hemorrhage present.     Pupils: Pupils are equal, round, and reactive to light.     Comments: Hordeolum noted at center of left lower eyelid. TTP. Crusting noted around the upper and lower left eyelash lines. Purulent discharge noted medially of left eye.   Cardiovascular:     Rate and Rhythm: Normal rate and regular rhythm.     Heart sounds: Normal heart sounds.  Pulmonary:     Effort: Pulmonary effort is normal.     Breath sounds: Normal breath sounds.     Comments: Clear to auscultation bilaterally  Abdominal:     General: Bowel sounds are normal.  Palpations: Abdomen is soft.     Tenderness: There is no abdominal tenderness.  Skin:    General: Skin is warm and dry.  Neurological:     General: No focal deficit present.     Mental Status: She is alert.  Psychiatric:        Mood and Affect: Mood and affect normal.     Results:  Labs: No results found for this or any previous visit (from the past 24 hours).  Radiology: No results found.   UC Course/Treatments:  Procedures: Procedures   Medications Ordered in UC: Medications - No data to display   Assessment and Plan :     ICD-10-CM   1. Hordeolum externum of left lower eyelid  H00.015     2. Acute bacterial conjunctivitis of left eye  H10.32       Hordeolum externum of left lower eyelid Afebrile, nontoxic-appearing, NAD. VSS. DDX includes but not limited to: hordeolum, chalazion, abscess Stye of left lower eyelid. Reports improvement  today. Recommend warm compresses and follow up with ophthalmologist tomorrow as previously scheduled. Strict ED precautions were given and patient verbalized understanding.  Acute bacterial conjunctivitis of left eye Afebrile, nontoxic-appearing, NAD. VSS. DDX includes but not limited to: Bacterial conjunctivitis, allergic conjunctivitis, viral conjunctivitis Given purulent  discharge and crusting noted on exam today, Polytrim 1 drop every 4 hours was prescribed.  Recommend follow-up with ophthalmologist tomorrow as previously scheduled. Strict ED precautions were given and patient verbalized understanding.   ED Discharge Orders          Ordered    trimethoprim-polymyxin b (POLYTRIM) ophthalmic solution  Every 4 hours        04/03/23 0831             PDMP not reviewed this encounter.     Cynda Acres, PA-C 04/03/23 0848

## 2023-04-03 NOTE — Discharge Instructions (Signed)
You have been prescribed Polytrim for your eyes. This is use to treat infections of the eye. Take the drops as directed. If no improvement, recommend you follow up with an ophthalmologist as soon as possible. You should keep your appointment for tomorrow no matter what.   Our office is limited in the tests and imaging we can perform at our facility.  Therefore, it is very important for you to pay attention to any new symptoms or worsening of your current condition.   Please go directly to the Emergency Department immediately should you begin to feel worse in any way or have any of the following symptoms: increased pain, increased discharge, vision changes, loss of vision, nausea/vomiting or headache.

## 2023-05-09 ENCOUNTER — Ambulatory Visit: Admission: EM | Admit: 2023-05-09 | Discharge: 2023-05-09 | Disposition: A | Discharge status: Home / Self Care

## 2023-05-09 DIAGNOSIS — R102 Pelvic and perineal pain: Secondary | ICD-10-CM | POA: Diagnosis not present

## 2023-05-09 DIAGNOSIS — N952 Postmenopausal atrophic vaginitis: Secondary | ICD-10-CM | POA: Diagnosis not present

## 2023-05-09 DIAGNOSIS — S30814A Abrasion of vagina and vulva, initial encounter: Secondary | ICD-10-CM

## 2023-05-09 HISTORY — DX: Unspecified osteoarthritis, unspecified site: M19.90

## 2023-05-09 MED ORDER — MUPIROCIN 2 % EX OINT: 1.0000 | TOPICAL_OINTMENT | Freq: Two times a day (BID) | CUTANEOUS | 0 refills | Status: AC

## 2023-05-09 NOTE — ED Triage Notes (Signed)
 Pt state she was washing herself and then she started having vaginal bleeding. She only bleeds when she is washing her vagina. She denies any vaginal discharge, dryness, urinary frequency, and any external cuts.    Start date: 05/08/2023  Interventions: Hemorrhoid cream

## 2023-05-09 NOTE — ED Provider Notes (Signed)
 [facility]    CSN: [id number] Arrival date & time: 05/09/23  0920      History   Chief Complaint Chief Complaint  Patient presents with   Vaginal Bleeding    HPI Rebecca Rasmussen is a 78 y.o. female.   Rebecca Rasmussen is a 78 y.o. female presenting for chief complaint of Vaginal Bleeding. She noticed vaginal bleeding after she washed her genitourinary region in the shower yesterday. Blood was minimal and bleeding stopped when she got out of the shower. She noticed bleeding again today when washing her GU area and reports bleeding was more significant today. History of atrophic vaginitis, denies use of estrogen cream. Additionally reports history of external hemorrhoids to the rectum and is confident bleeding is not coming from the rectum or the urinary tract. She has not seen any blood in the toilet or with wiping after voiding or having a bowel movement. Abdominal hysterectomy performed in 2001 due to uterine fibroids. She cannot remember the last time she had a pap smear and denies history of cervical abnormalities/malignancy. Denies vaginal discharge, vaginal odor, and vaginal itching. No dysuria, urinary frequency, urinary urgency, straining to void or defecate, abdominal pain, vaginal pain, vaginal lesions/rash, nausea, vomiting, diarrhea, flank pain, dizziness, and fever/chills. She does not take blood thinners. She is not sexually active.    Vaginal Bleeding   Past Medical History:  Diagnosis Date   Anxiety    Arthritis    Atrophic vaginitis    DDD (degenerative disc disease), lumbar 05/2012   had a fall with sslipped disck 06/14/12   Hypertension    Menopause 2001   Took ERT 08/1999 - 2/ 2007   Osteopenia 07/2008   Osteoporosis    osteopenia   Premature ovarian failure age 17    Patient Active Problem List   Diagnosis Date Noted   Gastroesophageal reflux disease without esophagitis 11/18/2014   Other chest pain 10/28/2013   Pure hypercholesterolemia  10/28/2013   Statin intolerance 10/28/2013   Osteoporosis    Menopause    Atrophic vaginitis    Anxiety    Premature ovarian failure    DDD (degenerative disc disease), lumbar 05/20/2012   Osteopenia 07/20/2008    Past Surgical History:  Procedure Laterality Date   ABDOMINAL HYSTERECTOMY  08/1999   secondary to fibroids   APPENDECTOMY     CARPAL TUNNEL RELEASE Right 05/30/2017   Procedure: RIGHT CARPAL TUNNEL RELEASE;  Surgeon: Cindee Salt, MD;  Location: [facility];  Service: Orthopedics;  Laterality: Right;   CARPOMETACARPEL SUSPENSION PLASTY Right 05/30/2017   Procedure: RIGHT CARPOMETACARPEL [facility]) SUSPENSION PLASTY TRAPEZIUM EXCISION;  Surgeon: Cindee Salt, MD;  Location: [facility];  Service: Orthopedics;  Laterality: Right;   EYE SURGERY     TENDON TRANSFER Right 05/30/2017   Procedure: ABDUCTOR POLLICIS LONGUS TRANSFER;  Surgeon: Cindee Salt, MD;  Location: [facility];  Service: Orthopedics;  Laterality: Right;   TOE SURGERY Left about 2008   cyst removed under left great toe    OB History     Gravida  0   Para  0   Term  0   Preterm  0   AB  0   Living  0      SAB  0   IAB  0   Ectopic  0   Multiple  0   Live Births  0            Home Medications  Prior to Admission medications   Medication Sig Start Date End Date Taking? Authorizing Provider  Calcium Carb-Cholecalciferol 600-20 MG-MCG TABS Take 1 tablet by mouth every morning. 10/10/16  Yes [provider]  diphenoxylate-atropine (LOMOTIL) 2.5-0.025 MG tablet TAKE 1 TABLET BY MOUTH 4 TIMES DAILY AS NEEDED FOR DIARRHEA . DO NOT EXCEED 4 PER 24 HOURS 04/03/23  Yes [provider]  Glucosamine Sulfate 1000 MG CAPS Take by mouth. 06/26/17  Yes [provider]  hydrochlorothiazide (HYDRODIURIL) 25 MG tablet Take 1 tablet by mouth daily. 11/25/17  Yes [provider]  magnesium (MAGTAB) 84 MG ( ) TBCR SR tablet Take 84 mg  by mouth.   Yes [provider]  moxifloxacin (VIGAMOX) 0.5 % ophthalmic solution One drop in the surgery eye 4 times daily one day prior to surgery 10/21/18  Yes [provider]  mupirocin ointment (BACTROBAN) 2 % Apply 1 Application topically 2 (two) times daily. 05/09/23  Yes Carlisle Beers, FNP  omega-3 acid ethyl esters (LOVAZA) 1 g capsule Take by mouth. 04/04/16  Yes [provider]  Pitavastatin Calcium (LIVALO) 1 MG TABS Take 1 tablet by mouth daily. 01/03/18  Yes [provider]  prednisoLONE acetate (PRED FORTE) 1 % ophthalmic suspension Apply to eye. 08/17/21  Yes [provider]  Sodium Fluoride (SODIUM FLUORIDE 5000 PPM) 1.1 % PSTE SMARTSIG:Sparingly By Mouth Twice Daily 11/09/22  Yes [provider]  acetaminophen (TYLENOL) 500 MG tablet Take by mouth.    [provider]  Ascorbic Acid (VITAMIN C) 500 MG CAPS Take by mouth. 06/26/17   [provider]  cyanocobalamin 1000 MCG tablet Take by mouth.    [provider]  cycloSPORINE (RESTASIS) 0.05 % ophthalmic emulsion Place 1 drop into both eyes 2 (two) times daily.    [provider]  dextromethorphan-guaiFENesin (MUCINEX DM) 30-600 MG 12hr tablet Take 1 tablet by mouth every 12 (twelve) hours.    [provider]  fluticasone (FLONASE) 50 MCG/ACT nasal spray Place 1 spray into both nostrils daily. Begin by using 2 sprays in each nare daily for 3 to 5 days, then decrease to 1 spray in each nare daily. 02/24/23   Theadora Rama Scales, PA-C  ipratropium (ATROVENT) 0.06 % nasal spray Place 2 sprays into both nostrils 3 (three) times daily. As needed for nasal congestion, runny nose 02/24/23   Theadora Rama Scales, PA-C  loratadine (CLARITIN) 10 MG tablet Take by mouth.    [provider]  Magnesium 400 MG CAPS Take by mouth.    [provider]  Omega-3 Fatty Acids (FISH OIL) 1000 MG CAPS Take 1 capsule by mouth every morning.     [provider]  Propylene Glycol (SYSTANE BALANCE) 0.6 % SOLN Apply to eye.    [provider]  Vitamin E 45 MG (100 UNIT) CAPS Take by mouth.    [provider]    Family History Family History  Problem Relation Age of Onset   Cancer - Lung Mother 69   Cirrhosis Father 59   Heart failure Maternal Grandmother    Cancer Paternal Grandmother    Cancer Paternal Grandfather     Social History Social History   Tobacco Use   Smoking status: Never   Smokeless tobacco: Never  Vaping Use   Vaping status: Never Used  Substance Use Topics   Alcohol use: No   Drug use: No     Allergies   Naproxen sodium, Zetia [ezetimibe], Clindamycin/lincomycin, Propoxyphene, Tizanidine, Atorvastatin  calcium, Crestor [rosuvastatin calcium], Darvocet [propoxyphene n-acetaminophen], Hydrocodone-acetaminophen, Lipitor [atorvastatin calcium], Prednisone, Promethazine, Vicodin [hydrocodone-acetaminophen], Zocor [simvastatin - high dose], Amoxicillin-pot clavulanate, and Naprosyn [naproxen]   Review of Systems Review of Systems  Genitourinary:  Positive for vaginal bleeding.     Physical Exam Triage Vital Signs ED Triage Vitals  Encounter Vitals Group     BP 05/09/23 0928 (!) 149/79     Systolic BP Percentile --      Diastolic BP Percentile --      Pulse Rate 05/09/23 0928 74     Resp --      Temp 05/09/23 0928 98.7 F (37.1 C)     Temp Source 05/09/23 0928 Oral     SpO2 05/09/23 0928 96 %     Weight --      Height --      Head Circumference --      Peak Flow --      Pain Score 05/09/23 0931 0     Pain Loc --      Pain Education --      Exclude from Growth Chart --    No data found.  Updated Vital Signs BP (!) 149/79 (BP Location: Right Arm)   Pulse 74   Temp 98.7 F (37.1 C) (Oral)   LMP 08/22/1999 (Exact Date)   SpO2 96%   Visual Acuity Right Eye Distance:   Left Eye Distance:   Bilateral Distance:    Right Eye Near:   Left Eye Near:     Bilateral Near:     Physical Exam Vitals and nursing note reviewed. Exam conducted with a chaperone present ([place], RN present for GU exam).  Constitutional:      Appearance: She is not ill-appearing or toxic-appearing.  HENT:     Head: Normocephalic and atraumatic.     Right Ear: Hearing and external ear normal.     Left Ear: Hearing and external ear normal.     Nose: Nose normal.     Mouth/Throat:     Lips: Pink.  Eyes:     General: Lids are normal. Vision grossly intact. Gaze aligned appropriately.     Extraocular Movements: Extraocular movements intact.     Conjunctiva/sclera: Conjunctivae normal.  Pulmonary:     Effort: Pulmonary effort is normal.  Genitourinary:    Exam position: Knee-chest position.     Pubic Area: No rash.      Labia:        Right: No rash, tenderness, lesion or injury.        Left: Tenderness (microabrasions), lesion (2 microabrasions to the left labia minora, tender to palpation) and injury present. No rash.      Urethra: No prolapse, urethral pain, urethral swelling or urethral lesion.       Comments: No blood on inspection to the vaginal introitus.  No external hemorrhoids or evidence of blood to the rectum.  Skin thinning to labia minora and majora present.  Musculoskeletal:     Cervical back: Neck supple.  Skin:    General: Skin is warm and dry.     Capillary Refill: Capillary refill takes less than 2 seconds.     Findings: No rash.  Neurological:     General: No focal deficit present.     Mental Status: She is alert and oriented to person, place, and time. Mental status is at baseline.     Cranial Nerves: No dysarthria or facial asymmetry.  Psychiatric:  Mood and Affect: Mood normal.        Speech: Speech normal.        Behavior: Behavior normal.        Thought Content: Thought content normal.        Judgment: Judgment normal.      UC Treatments / Results  Labs (all labs ordered are listed, but only abnormal results are  displayed) Labs Reviewed - No data to display  EKG   Radiology No results found.  Procedures Procedures (including critical care time)  Medications Ordered in UC Medications - No data to display  Initial Impression / Assessment and Plan / UC Course  I have reviewed the triage vital signs and the nursing notes.  Pertinent labs & imaging results that were available during my care of the patient were reviewed by me and considered in my medical decision making (see chart for details).   1. Abrasion of vagina, vaginal pain 2 small and tender abrasions present to the labia minora found on exam likely causing vaginal bleeding due to friction injury from washing vaginal region. No active bleeding on exam. History of atrophic vaginitis.  Low suspicion for bleeding from the cervix given blood is bright red and source of bleeding found on exam. Bleeding only happens when vaginal region is irritated and washed.  Offered/considered speculum exam, however given location of bleeding and low suspicion for cervical pathology, shared decision making used to defer this today.  No signs of acute vaginitis (BV/yeast).  No signs of folliculitis to the pubic area.  Will manage this with mupirocin BID for 7 days.  Advised to avoid scrubbing area with wash cloth/soap and only rinse with water to avoid causing further injury. Recommend follow-up with PCP for ongoing evaluation if symptoms fail to improve in the next 3-4 days.  Counseled patient on potential for adverse effects with medications prescribed/recommended today, strict ER and return-to-clinic precautions discussed, patient verbalized understanding.    Final Clinical Impressions(s) / UC Diagnoses   Final diagnoses:  Abrasion of vagina, initial encounter  Vaginal pain     Discharge Instructions      Apply mupirocin ointment to the left vaginal cut every 12 hours for the next 7 days.  To wash vaginal area, spray water to the vaginal  region and avoid scrubbing the region with wash cloth.  The skin of the vagina is thin and this can cause small abrasions like the one that you currently have.  Watch for further signs of infection such as redness, swelling, pus, or pain.   Please come back to urgent care or go to the ER if vaginal bleeding happens outside of washing/showering.  Follow-up with primary care to ensure symptoms are improving.      ED Prescriptions     Medication Sig Dispense Auth. Provider   mupirocin ointment (BACTROBAN) 2 % Apply 1 Application topically 2 (two) times daily. 22 g Carlisle Beers, FNP      PDMP not reviewed this encounter.   Carlisle Beers, [place] 05/09/23 1009

## 2023-05-09 NOTE — ED Notes (Signed)
 I was a chaperone for an external vaginal exam performed by Carlisle Beers, FNP.

## 2023-05-09 NOTE — Discharge Instructions (Signed)
 Apply mupirocin ointment to the left vaginal cut every 12 hours for the next 7 days.  To wash vaginal area, spray water to the vaginal region and avoid scrubbing the region with wash cloth.  The skin of the vagina is thin and this can cause small abrasions like the one that you currently have.  Watch for further signs of infection such as redness, swelling, pus, or pain.   Please come back to urgent care or go to the ER if vaginal bleeding happens outside of washing/showering.  Follow-up with primary care to ensure symptoms are improving.

## 2024-02-08 ENCOUNTER — Ambulatory Visit
Admission: EM | Admit: 2024-02-08 | Discharge: 2024-02-08 | Disposition: A | Discharge status: Home / Self Care | Attending: Family Medicine | Admitting: Family Medicine

## 2024-02-08 DIAGNOSIS — S99929A Unspecified injury of unspecified foot, initial encounter: Secondary | ICD-10-CM
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
## 2024-02-08 NOTE — Discharge Instructions (Signed)
 Continue to wear support hose daily (suggest obtaining left stocking with opening for toes).

## 2024-02-08 NOTE — ED Triage Notes (Signed)
 Patient states round the first week of November she had dropped an item on her right bit toe. She got a spot underneath the nail and then had toe swelling. She was told to keep her toe wrapped and use ice. She tried to follow up with her podiatrist although they were full.   Last week she tried Tylenol  due to having pain under her right big toe. Last night she started having right toe and right leg swelling up to her knee.
